# Patient Record
Sex: Male | Born: 1950 | Race: White | Hispanic: No | Marital: Single | Smoking: Former smoker
Health system: Southern US, Community
[De-identification: ages and names within clinical notes are randomized; demographics above are authoritative.]

## PROBLEM LIST (undated history)

## (undated) DIAGNOSIS — B1920 Unspecified viral hepatitis C without hepatic coma: Secondary | ICD-10-CM

## (undated) DIAGNOSIS — I10 Essential (primary) hypertension: Secondary | ICD-10-CM

## (undated) DIAGNOSIS — K429 Umbilical hernia without obstruction or gangrene: Secondary | ICD-10-CM

## (undated) DIAGNOSIS — K4090 Unilateral inguinal hernia, without obstruction or gangrene, not specified as recurrent: Secondary | ICD-10-CM

## (undated) DIAGNOSIS — K7460 Unspecified cirrhosis of liver: Secondary | ICD-10-CM

---

## 2013-10-24 ENCOUNTER — Encounter (HOSPITAL_COMMUNITY): Payer: Self-pay | Admitting: Emergency Medicine

## 2013-10-24 ENCOUNTER — Inpatient Hospital Stay (HOSPITAL_COMMUNITY)
Admission: EM | Admit: 2013-10-24 | Discharge: 2013-11-01 | DRG: 871 | Disposition: A | Discharge status: Home Health | Payer: Medicaid Other | Attending: Internal Medicine | Admitting: Internal Medicine

## 2013-10-24 ENCOUNTER — Emergency Department (HOSPITAL_COMMUNITY): Payer: Medicaid Other

## 2013-10-24 DIAGNOSIS — I10 Essential (primary) hypertension: Secondary | ICD-10-CM | POA: Diagnosis present

## 2013-10-24 DIAGNOSIS — B182 Chronic viral hepatitis C: Secondary | ICD-10-CM | POA: Diagnosis present

## 2013-10-24 DIAGNOSIS — E46 Unspecified protein-calorie malnutrition: Secondary | ICD-10-CM | POA: Diagnosis present

## 2013-10-24 DIAGNOSIS — J9600 Acute respiratory failure, unspecified whether with hypoxia or hypercapnia: Secondary | ICD-10-CM | POA: Diagnosis present

## 2013-10-24 DIAGNOSIS — A4101 Sepsis due to Methicillin susceptible Staphylococcus aureus: Secondary | ICD-10-CM

## 2013-10-24 DIAGNOSIS — K8020 Calculus of gallbladder without cholecystitis without obstruction: Secondary | ICD-10-CM | POA: Diagnosis present

## 2013-10-24 DIAGNOSIS — K859 Acute pancreatitis without necrosis or infection, unspecified: Secondary | ICD-10-CM | POA: Diagnosis present

## 2013-10-24 DIAGNOSIS — J15212 Pneumonia due to Methicillin resistant Staphylococcus aureus: Secondary | ICD-10-CM | POA: Diagnosis present

## 2013-10-24 DIAGNOSIS — A419 Sepsis, unspecified organism: Secondary | ICD-10-CM | POA: Diagnosis present

## 2013-10-24 DIAGNOSIS — Z79899 Other long term (current) drug therapy: Secondary | ICD-10-CM

## 2013-10-24 DIAGNOSIS — A4102 Sepsis due to Methicillin resistant Staphylococcus aureus: Principal | ICD-10-CM | POA: Diagnosis present

## 2013-10-24 DIAGNOSIS — K7460 Unspecified cirrhosis of liver: Secondary | ICD-10-CM

## 2013-10-24 DIAGNOSIS — R161 Splenomegaly, not elsewhere classified: Secondary | ICD-10-CM | POA: Diagnosis present

## 2013-10-24 DIAGNOSIS — K560 Paralytic ileus: Secondary | ICD-10-CM | POA: Diagnosis present

## 2013-10-24 DIAGNOSIS — Z886 Allergy status to analgesic agent status: Secondary | ICD-10-CM

## 2013-10-24 DIAGNOSIS — R6510 Systemic inflammatory response syndrome (SIRS) of non-infectious origin without acute organ dysfunction: Secondary | ICD-10-CM

## 2013-10-24 DIAGNOSIS — N179 Acute kidney failure, unspecified: Secondary | ICD-10-CM | POA: Diagnosis present

## 2013-10-24 DIAGNOSIS — K429 Umbilical hernia without obstruction or gangrene: Secondary | ICD-10-CM | POA: Diagnosis present

## 2013-10-24 DIAGNOSIS — J869 Pyothorax without fistula: Secondary | ICD-10-CM

## 2013-10-24 DIAGNOSIS — E236 Other disorders of pituitary gland: Secondary | ICD-10-CM | POA: Diagnosis present

## 2013-10-24 DIAGNOSIS — J90 Pleural effusion, not elsewhere classified: Secondary | ICD-10-CM | POA: Diagnosis present

## 2013-10-24 DIAGNOSIS — K4090 Unilateral inguinal hernia, without obstruction or gangrene, not specified as recurrent: Secondary | ICD-10-CM | POA: Diagnosis present

## 2013-10-24 DIAGNOSIS — K9189 Other postprocedural complications and disorders of digestive system: Secondary | ICD-10-CM | POA: Diagnosis present

## 2013-10-24 DIAGNOSIS — T361X5A Adverse effect of cephalosporins and other beta-lactam antibiotics, initial encounter: Secondary | ICD-10-CM | POA: Diagnosis not present

## 2013-10-24 DIAGNOSIS — K7030 Alcoholic cirrhosis of liver without ascites: Secondary | ICD-10-CM | POA: Diagnosis present

## 2013-10-24 DIAGNOSIS — Z87891 Personal history of nicotine dependence: Secondary | ICD-10-CM

## 2013-10-24 DIAGNOSIS — R233 Spontaneous ecchymoses: Secondary | ICD-10-CM | POA: Diagnosis not present

## 2013-10-24 DIAGNOSIS — D696 Thrombocytopenia, unspecified: Secondary | ICD-10-CM | POA: Diagnosis present

## 2013-10-24 DIAGNOSIS — E871 Hypo-osmolality and hyponatremia: Secondary | ICD-10-CM | POA: Diagnosis present

## 2013-10-24 DIAGNOSIS — N289 Disorder of kidney and ureter, unspecified: Secondary | ICD-10-CM

## 2013-10-24 DIAGNOSIS — N183 Chronic kidney disease, stage 3 unspecified: Secondary | ICD-10-CM | POA: Diagnosis present

## 2013-10-24 DIAGNOSIS — K567 Ileus, unspecified: Secondary | ICD-10-CM

## 2013-10-24 DIAGNOSIS — J9819 Other pulmonary collapse: Secondary | ICD-10-CM | POA: Diagnosis present

## 2013-10-24 DIAGNOSIS — J9601 Acute respiratory failure with hypoxia: Secondary | ICD-10-CM

## 2013-10-24 DIAGNOSIS — I129 Hypertensive chronic kidney disease with stage 1 through stage 4 chronic kidney disease, or unspecified chronic kidney disease: Secondary | ICD-10-CM | POA: Diagnosis present

## 2013-10-24 DIAGNOSIS — R188 Other ascites: Secondary | ICD-10-CM | POA: Diagnosis present

## 2013-10-24 DIAGNOSIS — E669 Obesity, unspecified: Secondary | ICD-10-CM | POA: Diagnosis present

## 2013-10-24 DIAGNOSIS — R7881 Bacteremia: Secondary | ICD-10-CM

## 2013-10-24 DIAGNOSIS — E8809 Other disorders of plasma-protein metabolism, not elsewhere classified: Secondary | ICD-10-CM | POA: Diagnosis present

## 2013-10-24 DIAGNOSIS — Z6832 Body mass index (BMI) 32.0-32.9, adult: Secondary | ICD-10-CM

## 2013-10-24 DIAGNOSIS — N170 Acute kidney failure with tubular necrosis: Secondary | ICD-10-CM | POA: Diagnosis present

## 2013-10-24 DIAGNOSIS — D539 Nutritional anemia, unspecified: Secondary | ICD-10-CM | POA: Diagnosis not present

## 2013-10-24 DIAGNOSIS — N184 Chronic kidney disease, stage 4 (severe): Secondary | ICD-10-CM | POA: Diagnosis present

## 2013-10-24 HISTORY — DX: Unspecified cirrhosis of liver: K74.60

## 2013-10-24 HISTORY — DX: Unspecified viral hepatitis C without hepatic coma: B19.20

## 2013-10-24 HISTORY — DX: Unilateral inguinal hernia, without obstruction or gangrene, not specified as recurrent: K40.90

## 2013-10-24 HISTORY — DX: Umbilical hernia without obstruction or gangrene: K42.9

## 2013-10-24 HISTORY — DX: Essential (primary) hypertension: I10

## 2013-10-24 LAB — CBC WITH DIFFERENTIAL/PLATELET
Basophils Absolute: 0 10*3/uL (ref 0.0–0.1)
Basophils Relative: 0 % (ref 0–1)
Eosinophils Absolute: 0 10*3/uL (ref 0.0–0.7)
Eosinophils Relative: 0 % (ref 0–5)
Lymphs Abs: 1.2 10*3/uL (ref 0.7–4.0)
MCH: 36.9 pg — ABNORMAL HIGH (ref 26.0–34.0)
MCHC: 36.5 g/dL — ABNORMAL HIGH (ref 30.0–36.0)
Monocytes Absolute: 2.5 10*3/uL — ABNORMAL HIGH (ref 0.1–1.0)
Neutro Abs: 27.4 10*3/uL — ABNORMAL HIGH (ref 1.7–7.7)
Neutrophils Relative %: 88 % — ABNORMAL HIGH (ref 43–77)
Platelets: 126 10*3/uL — ABNORMAL LOW (ref 150–400)
RBC: 3.52 MIL/uL — ABNORMAL LOW (ref 4.22–5.81)
RDW: 14.1 % (ref 11.5–15.5)

## 2013-10-24 LAB — URINALYSIS W MICROSCOPIC + REFLEX CULTURE
Glucose, UA: NEGATIVE mg/dL
Ketones, ur: 15 mg/dL — AB
Nitrite: NEGATIVE
Specific Gravity, Urine: 1.024 (ref 1.005–1.030)
Urobilinogen, UA: 0.2 mg/dL (ref 0.0–1.0)
pH: 5 (ref 5.0–8.0)

## 2013-10-24 LAB — COMPREHENSIVE METABOLIC PANEL
AST: 42 U/L — ABNORMAL HIGH (ref 0–37)
Alkaline Phosphatase: 119 U/L — ABNORMAL HIGH (ref 39–117)
BUN: 30 mg/dL — ABNORMAL HIGH (ref 6–23)
GFR calc Af Amer: 55 mL/min — ABNORMAL LOW (ref >90)
Glucose, Bld: 124 mg/dL — ABNORMAL HIGH (ref 70–99)
Potassium: 4 mEq/L (ref 3.5–5.1)
Total Bilirubin: 2.7 mg/dL — ABNORMAL HIGH (ref 0.3–1.2)
Total Protein: 7 g/dL (ref 6.0–8.3)

## 2013-10-24 LAB — RAPID URINE DRUG SCREEN, HOSP PERFORMED
Benzodiazepines: NOT DETECTED
Cocaine: NOT DETECTED
Opiates: NOT DETECTED

## 2013-10-24 LAB — AMMONIA: Ammonia: 46 umol/L (ref 11–60)

## 2013-10-24 LAB — MRSA PCR SCREENING: MRSA by PCR: POSITIVE — AB

## 2013-10-24 LAB — LIPASE, BLOOD: Lipase: 32 U/L (ref 11–59)

## 2013-10-24 LAB — PRO B NATRIURETIC PEPTIDE: Pro B Natriuretic peptide (BNP): 136 pg/mL — ABNORMAL HIGH (ref 0–125)

## 2013-10-24 LAB — LACTIC ACID, PLASMA: Lactic Acid, Venous: 1.9 mmol/L (ref 0.5–2.2)

## 2013-10-24 LAB — ETHANOL: Alcohol, Ethyl (B): <11 mg/dL (ref 0–11)

## 2013-10-24 MED ORDER — PIPERACILLIN-TAZOBACTAM 4.5 G IVPB: 4.5000 g | Freq: Once | INTRAVENOUS | Status: DC

## 2013-10-24 MED ORDER — ONDANSETRON HCL 4 MG/2ML IJ SOLN
4.0000 mg | Freq: Four times a day (QID) | INTRAMUSCULAR | Status: DC | PRN
  Administered 2013-10-24 – 2013-10-28 (×6): 4 mg via INTRAVENOUS
  Filled 2013-10-24 (×6): qty 2

## 2013-10-24 MED ORDER — PIPERACILLIN-TAZOBACTAM 3.375 G IVPB
3.3750 g | Freq: Three times a day (TID) | INTRAVENOUS | Status: DC
  Administered 2013-10-24 – 2013-10-26 (×5): 3.375 g via INTRAVENOUS
  Filled 2013-10-24 (×7): qty 50

## 2013-10-24 MED ORDER — VANCOMYCIN HCL IN DEXTROSE 1-5 GM/200ML-% IV SOLN
1000.0000 mg | Freq: Once | INTRAVENOUS | Status: AC
  Administered 2013-10-24: 1000 mg via INTRAVENOUS
  Filled 2013-10-24: qty 200

## 2013-10-24 MED ORDER — MORPHINE SULFATE 2 MG/ML IJ SOLN
2.0000 mg | INTRAMUSCULAR | Status: DC | PRN
Start: 2013-10-24 — End: 2013-10-25
  Administered 2013-10-24 – 2013-10-25 (×6): 2 mg via INTRAVENOUS
  Filled 2013-10-24 (×7): qty 1

## 2013-10-24 MED ORDER — PIPERACILLIN-TAZOBACTAM 3.375 G IVPB 30 MIN
3.3750 g | Freq: Once | INTRAVENOUS | Status: AC
  Administered 2013-10-24: 3.375 g via INTRAVENOUS
  Filled 2013-10-24: qty 50

## 2013-10-24 MED ORDER — SODIUM CHLORIDE 0.9 % IJ SOLN
3.0000 mL | Freq: Two times a day (BID) | INTRAMUSCULAR | Status: DC
  Administered 2013-10-24 – 2013-10-31 (×10): 3 mL via INTRAVENOUS

## 2013-10-24 MED ORDER — SODIUM CHLORIDE 0.9 % IV SOLN
INTRAVENOUS | Status: DC
  Administered 2013-10-24: 23:00:00 via INTRAVENOUS

## 2013-10-24 MED ORDER — VANCOMYCIN HCL IN DEXTROSE 750-5 MG/150ML-% IV SOLN
750.0000 mg | Freq: Two times a day (BID) | INTRAVENOUS | Status: DC
  Administered 2013-10-25 – 2013-11-01 (×15): 750 mg via INTRAVENOUS
  Filled 2013-10-24 (×17): qty 150

## 2013-10-24 MED ORDER — SODIUM CHLORIDE 0.9 % IV SOLN
INTRAVENOUS | Status: DC
  Administered 2013-10-24: 09:00:00 via INTRAVENOUS

## 2013-10-24 MED ORDER — IOHEXOL 300 MG/ML  SOLN
25.0000 mL | INTRAMUSCULAR | Status: AC
  Administered 2013-10-24: 25 mL via ORAL

## 2013-10-24 MED ORDER — FENTANYL CITRATE 0.05 MG/ML IJ SOLN
50.0000 ug | INTRAMUSCULAR | Status: AC | PRN
  Administered 2013-10-24 (×2): 50 ug via INTRAVENOUS
  Filled 2013-10-24: qty 2

## 2013-10-24 MED ORDER — FENTANYL CITRATE 0.05 MG/ML IJ SOLN
100.0000 ug | INTRAMUSCULAR | Status: AC | PRN
  Administered 2013-10-24 (×2): 100 ug via INTRAVENOUS
  Filled 2013-10-24 (×2): qty 2

## 2013-10-24 NOTE — ED Notes (Signed)
Called floor to give report. Floor changed pt's room. Waiting for room to be cleaned. RN stated she will call in approximately 10 minutes

## 2013-10-24 NOTE — ED Provider Notes (Signed)
CSN: [id number]     Arrival date & time 10/24/13  0813 History   First MD Initiated Contact with Patient 10/24/13 0825     Chief Complaint  Patient presents with  . Abdominal Pain  . Shortness of Breath    HPI Pt was seen at 0830. Per pt and his family, c/o gradual onset and worsening of persistent left sided abd "pain" for the past 5 days. Pt states he was evaluated at the [place] last Wednesday for same, told he was "dehydrated" and "had a UTI," rx cipro. States he has had left sided abd pain "since they pressed on my stomach at the [place]." Has been associated with SOB. Denies fevers, no CP/palpitations, no cough, no back pain, no N/V/D, no rash.    Past Medical History  Diagnosis Date  . Cirrhosis   . Hepatitis C   . Abdominal hernia     "for a really long time"  . Hypertension    History reviewed. No pertinent past surgical history.  History  Substance Use Topics  . Smoking status: Former Games developer  . Smokeless tobacco: Not on file  . Alcohol Use: No     Comment: Last etoh one month    Review of Systems ROS: Statement: All systems negative except as marked or noted in the HPI; Constitutional: Negative for fever and chills. ; ; Eyes: Negative for eye pain, redness and discharge. ; ; ENMT: Negative for ear pain, hoarseness, nasal congestion, sinus pressure and sore throat. ; ; Cardiovascular: +SOB. Negative for chest pain, palpitations, diaphoresis, and peripheral edema. ; ; Respiratory: Negative for cough, wheezing and stridor. ; ; Gastrointestinal: +abd pain. Negative for nausea, vomiting, diarrhea, blood in stool, hematemesis, jaundice and rectal bleeding. . ; ; Genitourinary: Negative for dysuria, flank pain and hematuria. ; ; Musculoskeletal: Negative for back pain and neck pain. Negative for swelling and trauma.; ; Skin: Negative for pruritus, rash, abrasions, blisters, bruising and skin lesion.; ; Neuro: Negative for headache, lightheadedness and neck stiffness. Negative for weakness,  altered level of consciousness , altered mental status, extremity weakness, paresthesias, involuntary movement, seizure and syncope.     Allergies  Codeine  Home Medications   Current Outpatient Rx  Name  Route  Sig  Dispense  Refill  . ciprofloxacin (CIPRO) 500 MG tablet   Oral   Take 500 mg by mouth 2 (two) times daily.         . furosemide (LASIX) 40 MG tablet   Oral   Take 40 mg by mouth daily.         Marland Kitchen ibuprofen (ADVIL,MOTRIN) 200 MG tablet   Oral   Take 1,000 mg by mouth daily as needed for mild pain or moderate pain.         Marland Kitchen lactulose (CHRONULAC) 10 GM/15ML solution   Oral   Take 20 g by mouth 3 (three) times daily.         . potassium chloride SA (K-DUR,KLOR-CON) 20 MEQ tablet   Oral   Take 20 mEq by mouth daily.         Marland Kitchen spironolactone (ALDACTONE) 100 MG tablet   Oral   Take 100 mg by mouth daily.          BP 116/78  Pulse 81  Temp(Src) 97.6 F (36.4 C) (Oral)  Resp 49  SpO2 91% Filed Vitals:   10/24/13 1415 10/24/13 1430 10/24/13 1445 10/24/13 1549  BP: 129/67 127/67 114/64 106/71  Pulse: 75 72 79 87  Temp:      TempSrc:      Resp: 39 24 20 20   SpO2: 92% 93% 93% 99%    Physical Exam 0835: Physical examination:  Nursing notes reviewed; Vital signs and O2 SAT reviewed;  Constitutional: Well developed, Well nourished, Uncomfortable appearing; Head:  Normocephalic, atraumatic; Eyes: EOMI, PERRL, No scleral icterus; ENMT: Mouth and pharynx normal, Mucous membranes dry; Neck: Supple, Full range of motion, No lymphadenopathy; Cardiovascular: Regular rate and rhythm, No gallop; Respiratory: Breath sounds coarse & equal bilaterally, No wheezes. Speaking full sentences, Normal respiratory effort/excursion; Chest: Nontender, Movement normal; Abdomen: Soft, +mid-epigastric, LUQ and LLQ tender to palp. +ventral hernia right abd NT to palp, reducible, no overlying erythema or ecchymosis. Nondistended, Normal bowel sounds; Genitourinary: No CVA  tenderness; Extremities: Pulses normal, No tenderness, No edema, No calf edema or asymmetry.; Neuro: AA&Ox3, Major CN grossly intact.  Speech clear. No gross focal motor or sensory deficits in extremities.; Skin: Color normal, Warm, Dry.   ED Course  Procedures    EKG Interpretation    Date/Time:    Ventricular Rate:  95 PR Interval:  142 QRS Duration: 96 QT Interval:  353 QTC Calculation: 444 R Axis:   -3 Text Interpretation:  Artifact Sinus rhythm Abnormal R-wave progression, early transition No significant change was found Since last tracing of earlier today            MDM  MDM Reviewed: nursing note and vitals Interpretation: labs, ECG, x-ray and CT scan     Results for orders placed during the hospital encounter of 10/24/13  URINALYSIS W MICROSCOPIC + REFLEX CULTURE      Result Value Range   Color, Urine AMBER (*) YELLOW   APPearance CLOUDY (*) CLEAR   Specific Gravity, Urine 1.024  1.005 - 1.030   pH 5.0  5.0 - 8.0   Glucose, UA NEGATIVE  NEGATIVE mg/dL   Hgb urine dipstick SMALL (*) NEGATIVE   Bilirubin Urine SMALL (*) NEGATIVE   Ketones, ur 15 (*) NEGATIVE mg/dL   Protein, ur NEGATIVE  NEGATIVE mg/dL   Urobilinogen, UA 0.2  0.0 - 1.0 mg/dL   Nitrite NEGATIVE  NEGATIVE   Leukocytes, UA TRACE (*) NEGATIVE   WBC, UA 0-2  <3 WBC/hpf   RBC / HPF 3-6  <3 RBC/hpf   Bacteria, UA RARE  RARE   Urine-Other MUCOUS PRESENT    CBC WITH DIFFERENTIAL      Result Value Range   WBC 31.1 (*) 4.0 - 10.5 K/uL   RBC 3.52 (*) 4.22 - 5.81 MIL/uL   Hemoglobin 14.2  13.0 - 17.0 g/dL   HCT 16.1 (*) 09.6 - 04.5 %   MCV 99.7  78.0 - 100.0 fL   MCH 36.9 (*) 26.0 - 34.0 pg   MCHC 36.5 (*) 30.0 - 36.0 g/dL   RDW 40.9  81.1 - 91.4 %   Platelets 126 (*) 150 - 400 K/uL   Neutrophils Relative % 88 (*) 43 - 77 %   Lymphocytes Relative 4 (*) 12 - 46 %   Monocytes Relative 8  3 - 12 %   Eosinophils Relative 0  0 - 5 %   Basophils Relative 0  0 - 1 %   Neutro Abs 27.4 (*) 1.7 -  7.7 K/uL   Lymphs Abs 1.2  0.7 - 4.0 K/uL   Monocytes Absolute 2.5 (*) 0.1 - 1.0 K/uL   Eosinophils Absolute 0.0  0.0 - 0.7 K/uL   Basophils Absolute 0.0  0.0 - 0.1 K/uL   RBC Morphology POLYCHROMASIA PRESENT     WBC Morphology TOXIC GRANULATION    PROTIME-INR      Result Value Range   Prothrombin Time 17.7 (*) 11.6 - 15.2 seconds   INR 1.50 (*) 0.00 - 1.49  COMPREHENSIVE METABOLIC PANEL      Result Value Range   Sodium 130 (*) 135 - 145 mEq/L   Potassium 4.0  3.5 - 5.1 mEq/L   Chloride 95 (*) 96 - 112 mEq/L   CO2 25  19 - 32 mEq/L   Glucose, Bld 124 (*) 70 - 99 mg/dL   BUN 30 (*) 6 - 23 mg/dL   Creatinine, Ser 1.61 (*) 0.50 - 1.35 mg/dL   Calcium 9.3  8.4 - 09.6 mg/dL   Total Protein 7.0  6.0 - 8.3 g/dL   Albumin 2.5 (*) 3.5 - 5.2 g/dL   AST 42 (*) 0 - 37 U/L   ALT 29  0 - 53 U/L   Alkaline Phosphatase 119 (*) 39 - 117 U/L   Total Bilirubin 2.7 (*) 0.3 - 1.2 mg/dL   GFR calc non Af Amer 47 (*) >90 mL/min   GFR calc Af Amer 55 (*) >90 mL/min  LIPASE, BLOOD      Result Value Range   Lipase 32  11 - 59 U/L  LACTIC ACID, PLASMA      Result Value Range   Lactic Acid, Venous 1.9  0.5 - 2.2 mmol/L  TROPONIN I      Result Value Range   Troponin I <0.30  <0.30 ng/mL  AMMONIA      Result Value Range   Ammonia 46  11 - 60 umol/L  PRO B NATRIURETIC PEPTIDE      Result Value Range   Pro B Natriuretic peptide (BNP) 136.0 (*) 0 - 125 pg/mL   Ct Abdomen Pelvis Wo Contrast 10/24/2013   CLINICAL DATA:  Pain left abdomen.  EXAM: CT ABDOMEN AND PELVIS WITHOUT CONTRAST  TECHNIQUE: Multidetector CT imaging of the abdomen and pelvis was performed following the standard protocol without intravenous contrast.  COMPARISON:  AP abdomen 10/24/2013.  FINDINGS: Liver is irregular suggesting cirrhosis. Splenomegaly. Prominent serpiginous structures are noted adjacent to the spleen suggesting varices. Mild peripancreatic edema noted. Mild pancreatitis cannot be excluded. Gallstones. The gallbladder is  nondistended. No biliary distention. No pericholecystic fluid collection. Gallbladder wall thickness normal by CT.  Adrenals normal. No focal renal abnormality. No hydronephrosis. Bladder nondistended. Prostate slightly irregular contour appearing calcifications in prostate. No free pelvic fluid.  Left inguinal hernia. No bowel herniation. Left hydrocele. No significant adenopathy. Aorta normal caliber.  Large umbilical hernia with herniation of fat only. Edema noted within the herniated fat suggesting cellulitis. No bowel herniation is noted. There are slightly distended loops of small bowel throughout the abdomen. The colonic gas pattern is normal, there is no colonic distention. Sigmoid colonic diverticulosis noted. These findings suggest the presence of an adynamic ileus versus partial small bowel obstruction. Adynamic ileus can occur secondary to pancreatitis. No free air. No pneumatosis.  Bilateral pleural effusions. Bibasilar atelectasis and/or pneumonia. Borderline cardiomegaly. Degenerative changes lumbar spine.  IMPRESSION: 1. Findings suggesting cirrhosis with splenomegaly and varices. 2. Gallstones.  No biliary distention . 3. Mild peripancreatic edema, pancreatitis cannot be excluded. Probable associated adynamic ileus as there is mild small bowel distention. Followup abdominal series to exclude developing small bowel obstruction suggested. 4. Umbilical hernia. Umbilical hernia is prominent with herniation of fat only. Edema  noted in the herniated fat suggesting cellulitis. 5. Left inguinal hernia with herniation of fat only. Associated left hydrocele. 6. Bilateral pleural effusions and dense bibasilar atelectasis and/or pneumonia.   Electronically Signed   By: Maisie Fus  Register   On: 10/24/2013 14:12   Dg Chest 1 View 10/24/2013   CLINICAL DATA:  Shortness of breath.  EXAM: CHEST - 1 VIEW  FINDINGS: The patient has taken a markedly shallow inspiration. This limits evaluation of the chest. There is  mild prominence of the interstitial markings particularly within the left hemi thorax accentuated by technique. Underlying component of mild edema versus a mild interstitial infiltrate, infectious or inflammatory, cannot be excluded. Repeat evaluation with deeper inspiration is recommended if clinically possible. The cardiac silhouette is poorly visualized. The visualized osseous structures are unremarkable.  IMPRESSION: 1. Markedly shallow inspiration 2. Interstitial findings likely secondary to technique nor underlying component of a mild interstitial infiltrate cannot be totally excluded particularly if clinically warranted. 3. Repeat evaluation recommended   Electronically Signed   By: Salome Holmes M.D.   On: 10/24/2013 10:00   Dg Abd 1 View 10/24/2013   CLINICAL DATA:  Abdominal pain.  EXAM: ABDOMEN - 1 VIEW  COMPARISON:  None.  FINDINGS: Air is seen within distended loops of large and small bowel. There is a paucity of distal bowel gas. Mild S-shaped scoliosis is appreciated within the lumbar spine. A smoothly marginated rounded area of increased density projects within the central aspect of the pelvic inlet. This may represent an internal device if clinically appropriate possibly external artifact.  IMPRESSION: Nonspecific nonobstructive bowel gas pattern. An early or partial small bowel obstruction versus an ileus cannot be excluded. Surveillance evaluation recommended. Smoothly marginated area of increased density at the level of pelvic inlet as described above.   Electronically Signed   By: Salome Holmes M.D.   On: 10/24/2013 09:58    1420:  Will start IV abx for undifferentiated sepsis. Remains afebrile. No N/V/D while in the ED. Dx and testing d/w pt and family.  Questions answered.  Verb understanding, agreeable to admit. T/C to [facility] Resident, case discussed, including:  HPI, pertinent PM/SHx, VS/PE, dx testing, ED course and treatment:  Agreeable to come to ED for eval to admit.  Laray Anger, DO 10/26/13 2109

## 2013-10-24 NOTE — ED Notes (Signed)
Internal medicine at bedside

## 2013-10-24 NOTE — H&P (Signed)
Date: 10/24/2013               Patient Name:  [name] [name] MRN: [id number]  DOB: [birth year] Age / Sex: 62 y.o., male   PCP: No Pcp Per Patient         Medical Service: Internal Medicine Teaching Service         Attending Physician: Dr. Inez Catalina, MD    First Contact: Dr. Johna Roles Pager: [phone]  Second Contact: Dr. Burtis Junes  Pager: [phone]       After Hours (After 5p/  First Contact Pager: [phone]  weekends / holidays): Second Contact Pager: [phone]   Chief Complaint: left sided abdominal pain  History of Present Illness:   [name] [name] is a 62 year old man with past medical history of HTN, untreated chronic Hepatitis C infection, compensated liver cirrhosis, and umbilical hernia who presents with left sided abdominal pain of 5 day duration. Pt reports that last Monday (1 week ago) he began having poor appetite and decreased PO intake. Two days later on Wed he began having shaking chills and fevers. He then went to the [place] in [place] and was found to have a UTI. He received IV fluids for dehydration and ciprofloxacin for UTI which he was prescribed to take 500 mg BID at home. He reports no urinary symptoms at that time or currently. Pt reports that the physician pressed on his abdomen a lot, especially his left side. Since then he has been having severe sharp left sided abdominal pain with radiation to his left flank and left rib cage that is associated with dyspnea. Pain is unrelieved if he leans forward but improves if he stands up. He no longer reports shaking chills or fever. He states he has been having intermittent nausea without vomiting for the past several days. He is on lactulose at home (for past history of hepatic encephalopathy) so he has loose stools at baseline without evidence of blood. He denies confusion, cough, rhinorrhea, sore throat, joint pain, bleeding, or rash. He has had no recent sick contacts . He has never had similar pain before and denies ever requiring   abdominal tap in the past. He reports being compliant with diuretic therapy (40 mg lasix & spironolactone 100 mg daily) for cirrhosis. His mother died at age 49 from colon cancer. He  states he has had colonoscopy in the past which he reports was normal. Also has had EGD in the past that was normal. He reports his last alcoholic drink was 1 month ago and that he no longer uses drugs or smokes.= cigarrettes.              Meds:  Medication Details Provider    ciprofloxacin (CIPRO) 500 MG tablet Take 500 mg by mouth 2 (two) times daily.     furosemide (LASIX) 40 MG tablet Take 40 mg by mouth daily.     ibuprofen (ADVIL,MOTRIN) 200 MG tablet Take 1,000 mg by mouth daily as needed for mild pain or moderate pain.     lactulose (CHRONULAC) 10 GM/15ML solution Take 20 g by mouth 3 (three) times daily.     potassium chloride SA (K-DUR,KLOR-CON) 20 MEQ tablet Take 20 mEq by mouth daily.     spironolactone (ALDACTONE) 100 MG tablet Take 100 mg by mouth daily.            Current Facility-Administered Medications  Medication Dose Route Frequency Provider Last Rate Last Dose  . 0.9 %  sodium chloride infusion  Intravenous Continuous Laray Anger, DO      . morphine 2 MG/ML injection 2 mg  2 mg Intravenous Q3H PRN Christen Bame, MD   2 mg at 10/24/13 1511   Current Outpatient Prescriptions  Medication Sig Dispense Refill  . ciprofloxacin (CIPRO) 500 MG tablet Take 500 mg by mouth 2 (two) times daily.      . furosemide (LASIX) 40 MG tablet Take 40 mg by mouth daily.      Marland Kitchen ibuprofen (ADVIL,MOTRIN) 200 MG tablet Take 1,000 mg by mouth daily as needed for mild pain or moderate pain.      Marland Kitchen lactulose (CHRONULAC) 10 GM/15ML solution Take 20 g by mouth 3 (three) times daily.      . potassium chloride SA (K-DUR,KLOR-CON) 20 MEQ tablet Take 20 mEq by mouth daily.      Marland Kitchen spironolactone (ALDACTONE) 100 MG tablet Take 100 mg by mouth daily.        Allergies: Allergies as of 10/24/2013 - Review Complete  10/24/2013  Allergen Reaction Noted  . Codeine Hives 10/24/2013   Past Medical History  Diagnosis Date  . Cirrhosis   . Hepatitis C   . Abdominal hernia     "for a really long time"  . Hypertension    History reviewed. No pertinent past surgical history. No family history on file. History   Social History  . Marital Status: Single    Spouse Name: N/A    Number of Children: N/A  . Years of Education: N/A   Occupational History  . Not on file.   Social History Main Topics  . Smoking status: Former Games developer  . Smokeless tobacco: Not on file  . Alcohol Use: No     Comment: Last etoh one month  . Drug Use: No  . Sexual Activity: Not on file   Other Topics Concern  . Not on file   Social History Narrative  . No narrative on file    Review of Systems: Review of Systems  Constitutional: Positive for chills and diaphoresis. Negative for fever and weight loss.  HENT: Negative for congestion and sore throat.   Respiratory: Positive for shortness of breath (with pain). Negative for cough.   Cardiovascular: Positive for chest pain (left sided rib cage). Negative for palpitations and leg swelling.  Gastrointestinal: Positive for nausea, abdominal pain and diarrhea (loose stools at baseline). Negative for vomiting, constipation, blood in stool and melena.  Genitourinary: Positive for flank pain (left sided). Negative for dysuria, urgency, frequency and hematuria.  Musculoskeletal: Negative for falls and joint pain.  Skin: Negative for rash.  Neurological: Positive for dizziness. Negative for focal weakness and headaches.  Endo/Heme/Allergies: Does not bruise/bleed easily.     Physical Exam: Blood pressure 114/64, pulse 79, temperature 97.6 F (36.4 C), temperature source Oral, resp. rate 20, SpO2 93.00%. Physical Exam  Constitutional: He is oriented to person, place, and time. He appears well-developed and well-nourished. No distress.  Obese, no gynecomastia  HENT:  Head:  Normocephalic and atraumatic.  Nose: Nose normal.  Mouth/Throat: Oropharynx is clear and moist. No oropharyngeal exudate.  Eyes: EOM are normal.  Neck: Normal range of motion. Neck supple.  Cardiovascular: Normal rate, regular rhythm and normal heart sounds.  Exam reveals no friction rub.   No murmur heard. Pulmonary/Chest: He has no wheezes. He has no rales. He exhibits tenderness (left sided ).  tachypneic   Abdominal: Bowel sounds are normal. He exhibits distension. There is tenderness (TTP of LUQ> LLQ).  There is no guarding. A hernia (large umbilical hernia ) is present.  No caput medusa  No fluid wave Splenomegaly  Musculoskeletal: Normal range of motion. He exhibits no edema (trace b/l pedal edema).  Neurological: He is alert and oriented to person, place, and time.  Skin: Skin is warm and dry. No rash noted. He is not diaphoretic. No erythema. No pallor.  No spider angiomas or telangiectasias  Psychiatric: He has a normal mood and affect. His behavior is normal. Judgment and thought content normal.  No asterixis       Lab results: Basic Metabolic Panel:  Recent Labs  16/10/96 0842  NA 130*  K 4.0  CL 95*  CO2 25  GLUCOSE 124*  BUN 30*  CREATININE 1.50*  CALCIUM 9.3   Liver Function Tests:  Recent Labs  10/24/13 0842  AST 42*  ALT 29  ALKPHOS 119*  BILITOT 2.7*  PROT 7.0  ALBUMIN 2.5*    Recent Labs  10/24/13 0842  LIPASE 32    Recent Labs  10/24/13 0842  AMMONIA 46   CBC:  Recent Labs  10/24/13 0842  WBC 31.1*  NEUTROABS 27.4*  HGB 14.2  HCT 35.1*  MCV 99.7  PLT 126*   Cardiac Enzymes:  Recent Labs  10/24/13 0842  TROPONINI <0.30   BNP:  Recent Labs  10/24/13 0842  PROBNP 136.0*   D-Dimer: No results found for this basename: DDIMER,  in the last 72 hours CBG: No results found for this basename: GLUCAP,  in the last 72 hours Hemoglobin A1C: No results found for this basename: HGBA1C,  in the last 72 hours Fasting  Lipid Panel: No results found for this basename: CHOL, HDL, LDLCALC, TRIG, CHOLHDL, LDLDIRECT,  in the last 72 hours Thyroid Function Tests: No results found for this basename: TSH, T4TOTAL, FREET4, T3FREE, THYROIDAB,  in the last 72 hours Anemia Panel: No results found for this basename: VITAMINB12, FOLATE, FERRITIN, TIBC, IRON, RETICCTPCT,  in the last 72 hours Coagulation:  Recent Labs  10/24/13 0842  LABPROT 17.7*  INR 1.50*   Urine Drug Screen: Drugs of Abuse  No results found for this basename: labopia, cocainscrnur, labbenz, amphetmu, thcu, labbarb    Alcohol Level: No results found for this basename: ETH,  in the last 72 hours Urinalysis:  Recent Labs  10/24/13 1240  COLORURINE AMBER*  LABSPEC 1.024  PHURINE 5.0  GLUCOSEU NEGATIVE  HGBUR SMALL*  BILIRUBINUR SMALL*  KETONESUR 15*  PROTEINUR NEGATIVE  UROBILINOGEN 0.2  NITRITE NEGATIVE  LEUKOCYTESUR TRACE*    Imaging results:  Ct Abdomen Pelvis Wo Contrast  10/24/2013   CLINICAL DATA:  Pain left abdomen.  EXAM: CT ABDOMEN AND PELVIS WITHOUT CONTRAST  TECHNIQUE: Multidetector CT imaging of the abdomen and pelvis was performed following the standard protocol without intravenous contrast.  COMPARISON:  AP abdomen 10/24/2013.  FINDINGS: Liver is irregular suggesting cirrhosis. Splenomegaly. Prominent serpiginous structures are noted adjacent to the spleen suggesting varices. Mild peripancreatic edema noted. Mild pancreatitis cannot be excluded. Gallstones. The gallbladder is nondistended. No biliary distention. No pericholecystic fluid collection. Gallbladder wall thickness normal by CT.  Adrenals normal. No focal renal abnormality. No hydronephrosis. Bladder nondistended. Prostate slightly irregular contour appearing calcifications in prostate. No free pelvic fluid.  Left inguinal hernia. No bowel herniation. Left hydrocele. No significant adenopathy. Aorta normal caliber.  Large umbilical hernia with herniation of fat  only. Edema noted within the herniated fat suggesting cellulitis. No bowel herniation is noted. There are slightly distended  loops of small bowel throughout the abdomen. The colonic gas pattern is normal, there is no colonic distention. Sigmoid colonic diverticulosis noted. These findings suggest the presence of an adynamic ileus versus partial small bowel obstruction. Adynamic ileus can occur secondary to pancreatitis. No free air. No pneumatosis.  Bilateral pleural effusions. Bibasilar atelectasis and/or pneumonia. Borderline cardiomegaly. Degenerative changes lumbar spine.  IMPRESSION: 1. Findings suggesting cirrhosis with splenomegaly and varices. 2. Gallstones.  No biliary distention . 3. Mild peripancreatic edema, pancreatitis cannot be excluded. Probable associated adynamic ileus as there is mild small bowel distention. Followup abdominal series to exclude developing small bowel obstruction suggested. 4. Umbilical hernia. Umbilical hernia is prominent with herniation of fat only. Edema noted in the herniated fat suggesting cellulitis. 5. Left inguinal hernia with herniation of fat only. Associated left hydrocele. 6. Bilateral pleural effusions and dense bibasilar atelectasis and/or pneumonia.   Electronically Signed   By: Maisie Fus  Register   On: 10/24/2013 14:12   Dg Chest 1 View  10/24/2013   CLINICAL DATA:  Shortness of breath.  EXAM: CHEST - 1 VIEW  FINDINGS: The patient has taken a markedly shallow inspiration. This limits evaluation of the chest. There is mild prominence of the interstitial markings particularly within the left hemi thorax accentuated by technique. Underlying component of mild edema versus a mild interstitial infiltrate, infectious or inflammatory, cannot be excluded. Repeat evaluation with deeper inspiration is recommended if clinically possible. The cardiac silhouette is poorly visualized. The visualized osseous structures are unremarkable.  IMPRESSION: 1. Markedly shallow  inspiration 2. Interstitial findings likely secondary to technique nor underlying component of a mild interstitial infiltrate cannot be totally excluded particularly if clinically warranted. 3. Repeat evaluation recommended   Electronically Signed   By: Salome Holmes M.D.   On: 10/24/2013 10:00   Dg Abd 1 View  10/24/2013   CLINICAL DATA:  Abdominal pain.  EXAM: ABDOMEN - 1 VIEW  COMPARISON:  None.  FINDINGS: Air is seen within distended loops of large and small bowel. There is a paucity of distal bowel gas. Mild S-shaped scoliosis is appreciated within the lumbar spine. A smoothly marginated rounded area of increased density projects within the central aspect of the pelvic inlet. This may represent an internal device if clinically appropriate possibly external artifact.  IMPRESSION: Nonspecific nonobstructive bowel gas pattern. An early or partial small bowel obstruction versus an ileus cannot be excluded. Surveillance evaluation recommended. Smoothly marginated area of increased density at the level of pelvic inlet as described above.   Electronically Signed   By: Salome Holmes M.D.   On: 10/24/2013 09:58    Other results: EKG: Date/Time:  Ventricular Rate: 95  PR Interval: 142  QRS Duration: 96  QT Interval: 353  QTC Calculation: 444  R Axis: -3  Text Interpretation: Artifact Sinus rhythm Abnormal R-wave progression, early transition No significant change was found Since last tracing of earlier today   Assessment & Plan by Problem: Principal Problem:   Sepsis Active Problems:   Hep C w/o coma, chronic   Liver cirrhosis   Assessment: 62 year old man with past medical history of HTN, untreated chronic Hepatitis C infection, liver cirrhosis, and umbilical hernia who presented on 10/24/13 with left sided abdominal pain of 5 day duration.   Plan:    SIRS without source of infection - Pt with left sided upper quadrant pain with radiation to flank on admission with tachypnea (28-49)  and leukocytosis (31K) with left shift (>20% bands) and neutrophilia (88%). Source of  infection is currently unknown. Lipase within normal limits however on CT abdomen there is presence of mild peripancreatic edema and bilateral pleural effusions concerning for mild pancreatitis. He reports no recent alcohol use. Pyelonephritis possible due to history of rigors and reported UTI 5 days ago (current UA w/o infection) with  left sided CVA tenderness. There was no hydronephrosis on imaging. SBP could also be possible, however no ascites on CT abdomen and no fluid wave on exam for paracentesis. Lactic acid levels were normal and there was no AG on admission to suggest ischemic bowel.  Pt with large umbilical hernia (reports has been present for long time) with herniation of fat only and questionable early or partial SBO vs adynamic ileus. Pt without fever and cough but dyspnea (with pain) and bilateral pleural effusions and dense bibasilar atelectasis with concern for pneumonia. There was evidence of diverticulosis and cholelithiasis on imaging however no evidence of acute cholecystitis or diverticulitis. Troponin(x1) was negative and 12-lead EKG was NSR without cardiac ischemic changes. Pt received IV vancomycin and zosyn in ED.   -NPO for bowel rest -75 mL/hr  IV fluids -Obtain blood cultures x 2 -Start IV vancomycin & zosyn for broad spectrum coverage  -Morphine PRN pain  -Add cefotaxime if decompensation for SBP  -Consider CT chest   Chronic Hepatitis C infection - Pt reports he was never treated. Complicated by liver cirrhosis. On admission pt with transaminitis (elevated AST 42).  No evidence of rash, glomerulonephritis, or neuropathy to suggest cryoglobulinemia.  -Obtain UDS -Limit tylenol use -Obtain HIV Ab  Compensated Liver Cirrhosis -  Most likely secondary to past alcohol abuse and untreated viral hepatitis C infection. There was evidence of splenomegaly on CT abdomen and distension on exam  without other signs of stigmata of chronic liver disease. Pt's albumin is 2.5, bilirubin 2.4,  INR 1.5, ascites that is medically controlled, and encephalopathy medically controlled with Child-Pugh Class C with life expectancy 1-3 years. CT abdomen with findings of cirrhosis with splenomegaly. Pt without ascites, fever, and encephalopathy, however with significant abdominal pain and distension still concern for SBP.  Pt with last reported alcoholic drink 1 month ago. Pt reports compliance with diuretic therapy at home. On exam there was distension however hard to appreciate ascites, thus paracentesis was not performed. -Hold home 40 mg lasix & spironolactone 100 mg daily, lactulose 20 TID  -Obtain ethanol levels -Add cefotaxime 4g/d if decompensation for SBP    Thrombocytopenia  Pt with splenomegaly and  platelet count on admission of 126K (clumping present). Baseline platelet count unknown. No reports of recent bleeding. -Continue to monitor CBC  Renal Insufficiency - Pt presented with Cr of 1.5 with unknown baseline. Possible etiology is pre-renal azotemia (hypovolemia) vs ATN (sepsis). Post-renal causes less likely considering no hydronpehrosis on CT abdomen  Pt without HTN, edema, or proteinuria on UA to suggest nephrotic syndrome or glomerulonephritis.   -Hold home 40 mg lasix & spironolactone 100 mg daily, lactulose 20 TID  -Avoid nephrotoxins (ACEi, ARB, NSAIDS, contrast) -Continue to monitor BMP     Diet: NPO DVT PPx: SCD Code: Full if >50% chance of survival      Dispo: Disposition is deferred at this time, awaiting improvement of current medical problems. Anticipated discharge in approximately 2-4 day(s).   The patient does have a current PCP (No Pcp Per Patient) and does need an [facility] hospital follow-up appointment after discharge.  The patient does not have transportation limitations that hinder transportation to clinic appointments.  Signed:  Otis Brace, MD 10/24/2013, 3:15  PM

## 2013-10-24 NOTE — ED Notes (Signed)
Pt was seen at [facility] last week for severe shaking and was found to have fever, uti, and dehydration.  Pt is here today LUQ pain and shortness of breath since when he was in the [place] and they pressed in that area.  Pt has liver issues

## 2013-10-24 NOTE — Progress Notes (Signed)
ANTIBIOTIC CONSULT NOTE - INITIAL  Pharmacy Consult for vancomycin and Zosyn Indication: sepsis  Allergies  Allergen Reactions  . Codeine Hives    Patient Measurements:   Adjusted Body Weight: 91 kg, 68.5 inches  Vital Signs: Temp: 97.6 F (36.4 C) (11/17 0825) Temp src: Oral (11/17 0825) BP: 106/71 mmHg (11/17 1549) Pulse Rate: 87 (11/17 1549) Intake/Output from previous day:   Intake/Output from this shift:    Labs:  Recent Labs  10/24/13 0842  WBC 31.1*  HGB 14.2  PLT 126*  CREATININE 1.50*   CrCl is unknown because there is no height on file for the current visit. No results found for this basename: VANCOTROUGH, VANCOPEAK, VANCORANDOM, GENTTROUGH, GENTPEAK, GENTRANDOM, TOBRATROUGH, TOBRAPEAK, TOBRARND, AMIKACINPEAK, AMIKACINTROU, AMIKACIN,  in the last 72 hours   Microbiology: No results found for this or any previous visit (from the past 720 hour(s)).  Medical History: Past Medical History  Diagnosis Date  . Cirrhosis   . Hepatitis C   . Abdominal hernia     "for a really long time"  . Hypertension     Medications:  See med history Assessment: 62 year old man to be admitted for sepsis.  Vancomycin and Zosyn to start empirically.  Goal of Therapy:  Vancomycin trough level 15-20 mcg/ml  Plan:  Measure antibiotic drug levels at steady state Follow up culture results Vancomycin 1g IV x 1 dose, then 750mg  IV q12 Zosyn 3.375g IV q8h (infuse over 4 hours)   Mickeal Skinner 10/24/2013,4:06 PM

## 2013-10-25 ENCOUNTER — Encounter (HOSPITAL_COMMUNITY): Payer: Self-pay | Admitting: Radiology

## 2013-10-25 ENCOUNTER — Inpatient Hospital Stay (HOSPITAL_COMMUNITY): Payer: Medicaid Other

## 2013-10-25 DIAGNOSIS — D696 Thrombocytopenia, unspecified: Secondary | ICD-10-CM | POA: Diagnosis present

## 2013-10-25 DIAGNOSIS — N179 Acute kidney failure, unspecified: Secondary | ICD-10-CM | POA: Diagnosis present

## 2013-10-25 DIAGNOSIS — I10 Essential (primary) hypertension: Secondary | ICD-10-CM | POA: Diagnosis present

## 2013-10-25 DIAGNOSIS — J90 Pleural effusion, not elsewhere classified: Secondary | ICD-10-CM | POA: Diagnosis present

## 2013-10-25 DIAGNOSIS — E8809 Other disorders of plasma-protein metabolism, not elsewhere classified: Secondary | ICD-10-CM | POA: Diagnosis present

## 2013-10-25 DIAGNOSIS — K567 Ileus, unspecified: Secondary | ICD-10-CM | POA: Diagnosis present

## 2013-10-25 DIAGNOSIS — K7460 Unspecified cirrhosis of liver: Secondary | ICD-10-CM

## 2013-10-25 DIAGNOSIS — J9601 Acute respiratory failure with hypoxia: Secondary | ICD-10-CM | POA: Diagnosis present

## 2013-10-25 DIAGNOSIS — E871 Hypo-osmolality and hyponatremia: Secondary | ICD-10-CM | POA: Diagnosis present

## 2013-10-25 DIAGNOSIS — A419 Sepsis, unspecified organism: Secondary | ICD-10-CM

## 2013-10-25 DIAGNOSIS — K859 Acute pancreatitis without necrosis or infection, unspecified: Secondary | ICD-10-CM | POA: Diagnosis present

## 2013-10-25 LAB — HIV ANTIBODY (ROUTINE TESTING W REFLEX): HIV: NONREACTIVE

## 2013-10-25 LAB — COMPREHENSIVE METABOLIC PANEL
Alkaline Phosphatase: 103 U/L (ref 39–117)
BUN: 33 mg/dL — ABNORMAL HIGH (ref 6–23)
CO2: 25 mEq/L (ref 19–32)
Chloride: 95 mEq/L — ABNORMAL LOW (ref 96–112)
Creatinine, Ser: 1.52 mg/dL — ABNORMAL HIGH (ref 0.50–1.35)
GFR calc Af Amer: 54 mL/min — ABNORMAL LOW (ref >90)
GFR calc non Af Amer: 47 mL/min — ABNORMAL LOW (ref >90)
Glucose, Bld: 121 mg/dL — ABNORMAL HIGH (ref 70–99)
Potassium: 4.7 mEq/L (ref 3.5–5.1)
Total Bilirubin: 3.1 mg/dL — ABNORMAL HIGH (ref 0.3–1.2)

## 2013-10-25 LAB — BLOOD GAS, ARTERIAL
Acid-Base Excess: 2.3 mmol/L — ABNORMAL HIGH (ref 0.0–2.0)
Bicarbonate: 26.7 mEq/L — ABNORMAL HIGH (ref 20.0–24.0)
TCO2: 28.1 mmol/L (ref 0–100)
pCO2 arterial: 44.7 mmHg (ref 35.0–45.0)
pH, Arterial: 7.394 (ref 7.350–7.450)
pO2, Arterial: 63.9 mmHg — ABNORMAL LOW (ref 80.0–100.0)

## 2013-10-25 LAB — CBC
Hemoglobin: 13.7 g/dL (ref 13.0–17.0)
MCH: 36.6 pg — ABNORMAL HIGH (ref 26.0–34.0)
Platelets: 129 10*3/uL — ABNORMAL LOW (ref 150–400)
Platelets: 178 10*3/uL (ref 150–400)
RBC: 3.55 MIL/uL — ABNORMAL LOW (ref 4.22–5.81)
RBC: 3.77 MIL/uL — ABNORMAL LOW (ref 4.22–5.81)
RDW: 14.7 % (ref 11.5–15.5)
RDW: 14.9 % (ref 11.5–15.5)
WBC: 36.5 10*3/uL — ABNORMAL HIGH (ref 4.0–10.5)
WBC: 37 10*3/uL — ABNORMAL HIGH (ref 4.0–10.5)

## 2013-10-25 LAB — TROPONIN I: Troponin I: <0.3 ng/mL (ref <0.30)

## 2013-10-25 LAB — BASIC METABOLIC PANEL
CO2: 21 mEq/L (ref 19–32)
Calcium: 8.3 mg/dL — ABNORMAL LOW (ref 8.4–10.5)
Glucose, Bld: 105 mg/dL — ABNORMAL HIGH (ref 70–99)
Potassium: 5.6 mEq/L — ABNORMAL HIGH (ref 3.5–5.1)
Sodium: 129 mEq/L — ABNORMAL LOW (ref 135–145)

## 2013-10-25 LAB — PROCALCITONIN: Procalcitonin: 1.78 ng/mL

## 2013-10-25 LAB — LACTIC ACID, PLASMA: Lactic Acid, Venous: 2 mmol/L (ref 0.5–2.2)

## 2013-10-25 MED ORDER — MORPHINE SULFATE 2 MG/ML IJ SOLN
2.0000 mg | Freq: Once | INTRAMUSCULAR | Status: AC
  Administered 2013-10-25: 2 mg via INTRAVENOUS

## 2013-10-25 MED ORDER — MUPIROCIN 2 % EX OINT
1.0000 "application " | TOPICAL_OINTMENT | Freq: Two times a day (BID) | CUTANEOUS | Status: AC
  Administered 2013-10-26 – 2013-10-29 (×8): 1 via NASAL
  Filled 2013-10-25 (×2): qty 22

## 2013-10-25 MED ORDER — METRONIDAZOLE IN NACL 5-0.79 MG/ML-% IV SOLN
500.0000 mg | Freq: Three times a day (TID) | INTRAVENOUS | Status: DC
  Administered 2013-10-25 – 2013-10-26 (×3): 500 mg via INTRAVENOUS
  Filled 2013-10-25 (×6): qty 100

## 2013-10-25 MED ORDER — SODIUM CHLORIDE 0.9 % IV SOLN
INTRAVENOUS | Status: DC
  Administered 2013-10-29: 10 mL/h via INTRAVENOUS
  Administered 2013-10-31: 07:00:00 via INTRAVENOUS

## 2013-10-25 MED ORDER — CHLORHEXIDINE GLUCONATE CLOTH 2 % EX PADS
6.0000 | MEDICATED_PAD | Freq: Every day | CUTANEOUS | Status: AC
  Administered 2013-10-25 – 2013-10-29 (×5): 6 via TOPICAL

## 2013-10-25 MED ORDER — MORPHINE SULFATE 2 MG/ML IJ SOLN
2.0000 mg | Freq: Four times a day (QID) | INTRAMUSCULAR | Status: DC | PRN
  Administered 2013-10-25 – 2013-10-31 (×15): 2 mg via INTRAVENOUS
  Filled 2013-10-25 (×15): qty 1

## 2013-10-25 MED ORDER — CHLORHEXIDINE GLUCONATE 0.12 % MT SOLN
15.0000 mL | Freq: Two times a day (BID) | OROMUCOSAL | Status: DC
  Administered 2013-10-25 – 2013-11-01 (×13): 15 mL via OROMUCOSAL
  Filled 2013-10-25 (×17): qty 15

## 2013-10-25 MED ORDER — BIOTENE DRY MOUTH MT LIQD
15.0000 mL | Freq: Two times a day (BID) | OROMUCOSAL | Status: DC
  Administered 2013-10-25 – 2013-11-01 (×14): 15 mL via OROMUCOSAL

## 2013-10-25 MED ORDER — FUROSEMIDE 40 MG PO TABS
40.0000 mg | ORAL_TABLET | Freq: Every day | ORAL | Status: DC
  Filled 2013-10-25: qty 1

## 2013-10-25 MED ORDER — FUROSEMIDE 10 MG/ML IJ SOLN
40.0000 mg | Freq: Every day | INTRAMUSCULAR | Status: DC
  Administered 2013-10-25: 40 mg via INTRAVENOUS
  Filled 2013-10-25 (×2): qty 4

## 2013-10-25 NOTE — H&P (Signed)
  Date: 10/25/2013  Patient name: [name] [name]  Medical record number: [id number]  Date of birth: 12-05-1951   I have seen and evaluated [name] [name] and discussed their care with the Residency Team.  I saw [name] [name] who confirmed the history.  [name] [name] noted that since last week (monday) [name] [name] has been having decreased appetite and PO intake.  On Wed of last week, [name] [name] noted shaking chills and presented to the [place] in [place] where [name] [name] was diagnosed with a UTI.  [name] [name] did not have any urinary symptoms at the time.  [name] [name] was given IVF and Ciprofloxacin which [name] [name] reports that [name] [name] did take.  [name] [name] reports that since being evaluated by a physician, [name] [name] has had left sided chest and abdominal pain which has been worse with movement and taking a deep breath.  [name] [name] notes also lower left chest pain.  Associated symptoms in the past few days have been intermittent nausea and vomiting.  [name] [name] also has chronic loose stools due to lactulose use.  [name] [name] denies any blood.  [name] [name] has not had any further chills/fever.  [name] [name] is currently on home diuretic therapy due to his cirrhosis, which [name] [name] has been taking.  On exam, [name] [name] is alert, mildly jaundiced, mildly tachypneic with crackles on the left up to the mid chest.  [name] [name] has a distended abdomen, which is not tender except in the LUQ.  [name] [name] has TTP over the left rib cage, side and flank.  Possibly some CVA tenderness on the left, but difficult for patient to differentiate.  [name] [name] reports last BM on the morning of admission.  [name] [name] reports continued flatus.  Please refer to imaging in the resident note.  [name] [name] was noted to have an elevated WBC to 31 with a left shift and bandemia.  [name] [name] was also noted to have a mild hyponatremia and elevated Cr, unclear if this is chronic.  Tbili was also elevated.    Assessment and Plan: I have seen and evaluated the patient as outlined above. I agree with the formulated Assessment and Plan as detailed in the residents' admission note, with the following changes:   1. SIRS with unclear source -  PNA vs. SBP vs. Pancreatitis (fluid on CT scan) vs. Pyelonephritis - Broad spectrum Abx to start, I favor a pneumonia given his intermittent tachypnea and hypoxia on vitals.  [name] [name] also had dense bibasilar atelectasis and/or pneumonia on CT abdomen. - O2 as needed to keep O2 sats > 93% - Cultures, blood - Hold morphine for pain control as likely causing depressed BP - No good site on CT scan for paracentesis - Monitor in SDU - CBC/BMET follow - Continue NPO - IVF with normal saline sparingly given cirrhosis  2. Ileus - Will repeat CXR and AXR - IF worsening, consider NGT, will need to get records of EGD for varices prior to NGT if possible - NPO, bowel rest  Also with compensated liver cirrhosis currently, low platelets, renal insufficiency vs. CKD.  These are noted in the resident note.   Inez Catalina, MD 11/18/20141:00 PM

## 2013-10-25 NOTE — Progress Notes (Signed)
Utilization Review Completed.  

## 2013-10-25 NOTE — Consult Note (Signed)
PULMONARY  / CRITICAL CARE MEDICINE  Name: [name] [name] MRN: [id number] DOB: [birth year]    ADMISSION DATE:  10/24/2013 CONSULTATION DATE:  10/25/13  REFERRING MD :  Dr. Criselda Peaches PRIMARY SERVICE:  Internal medicine teaching service  CHIEF COMPLAINT:  Abdominal pain, distention, and shortness of breath   BRIEF PATIENT DESCRIPTION: 62 y.o. With HTN, untreated chronic hep C, cirrhosis and umbilical hernia was admitted 11/17 for hypovolemia and left sided abdominal pain secondary to a UTI diagnosed at [facility] in [place].  PCCM called to evaluate pleural effusion and ascites.  SIGNIFICANT EVENTS / STUDIES:  11/12 diagnosed with UTI at [facility] in [place] given Cipro 11/17 admitted 11/18 CT abd and pelvis Cirrhosis, large left pleural effusion, slightly dilated bowel loops no obstruction likely an ileus.  Cholelithiasis, and inflammation of pancreatic head.   LINES / TUBES: PIV  CULTURES: 11/17 Blood x2 >> neg  ANTIBIOTICS: 11/18 Flagyl >> 11/18 Vanc >> 11/18 Zosyn >>  PAST MEDICAL HISTORY :  Past Medical History  Diagnosis Date  . Cirrhosis   . Hepatitis C   . Abdominal hernia     "for a really long time"  . Hypertension    History reviewed. No pertinent past surgical history. Prior to Admission medications   Medication Sig Start Date End Date Taking? Authorizing Provider  ciprofloxacin (CIPRO) 500 MG tablet Take 500 mg by mouth 2 (two) times daily.   Yes Historical Provider, MD  furosemide (LASIX) 40 MG tablet Take 40 mg by mouth daily.   Yes Historical Provider, MD  ibuprofen (ADVIL,MOTRIN) 200 MG tablet Take 1,000 mg by mouth daily as needed for mild pain or moderate pain.   Yes Historical Provider, MD  lactulose (CHRONULAC) 10 GM/15ML solution Take 20 g by mouth 3 (three) times daily.   Yes Historical Provider, MD  potassium chloride SA (K-DUR,KLOR-CON) 20 MEQ tablet Take 20 mEq by mouth daily.   Yes Historical Provider, MD  spironolactone (ALDACTONE) 100 MG tablet Take 100 mg  by mouth daily.   Yes Historical Provider, MD   Allergies  Allergen Reactions  . Codeine Hives    FAMILY HISTORY:  No family history on file. SOCIAL HISTORY:  reports that he has quit smoking. He does not have any smokeless tobacco history on file. He reports that he does not drink alcohol or use illicit drugs.  REVIEW OF SYSTEMS:   Constitutional: Negative for fever, chills, weight loss, malaise/fatigue and diaphoresis.  HENT: Negative for hearing loss, ear pain, nosebleeds, congestion, sore throat, neck pain, tinnitus and ear discharge.   Eyes: Negative for blurred vision, double vision, photophobia, pain, discharge and redness.  Respiratory: Negative for cough, hemoptysis, sputum production, shortness of breath, wheezing and stridor.   Cardiovascular: Negative for chest pain, palpitations, orthopnea, claudication, leg swelling and PND.  Gastrointestinal: Negative for heartburn, nausea, vomiting, abdominal pain, diarrhea, constipation, blood in stool and melena.  Genitourinary: Negative for dysuria, urgency, frequency, hematuria and flank pain.  Musculoskeletal: Negative for myalgias, back pain, joint pain and falls.  Skin: Negative for itching and rash.  Neurological: Negative for dizziness, tingling, tremors, sensory change, speech change, focal weakness, seizures, loss of consciousness, weakness and headaches.  Endo/Heme/Allergies: Negative for environmental allergies and polydipsia. Does not bruise/bleed easily.  SUBJECTIVE:   VITAL SIGNS: Temp:  [97.6 F (36.4 C)-99 F (37.2 C)] 98.4 F (36.9 C) (11/18 1200) Pulse Rate:  [72-100] 82 (11/18 1200) Resp:  [18-42] 28 (11/18 0838) BP: (97-133)/(60-92) 103/65 mmHg (11/18 1200) SpO2:  [88 %-99 %]  93 % (11/18 1200) FiO2 (%):  [50 %] 50 % (11/18 0432) Weight:  [210 lb 12.2 oz (95.6 kg)] 210 lb 12.2 oz (95.6 kg) (11/17 1900)  PHYSICAL EXAMINATION: General:  Chronically ill appearing male, resting comfortably Neuro:  A&Ox4,  appropriate conversation skills, moves extremitiesx4 HEENT:  Mild scleral icterus, MM moist and pink Neck:  No JVD Cardiovascular:  RRR, no M  Lungs:  Diminished breath sounds L>R, with scattered fine rales Abdomen:  +BS, mildly protuberant, tender palpation Musculoskeletal:  No gross deformities, BLE swelling 2+ Skin:  Mildly jaundiced, no obvious breakdowns  Recent Labs Lab 10/24/13 0842 10/25/13 0614  NA 130* 129*  K 4.0 4.7  CL 95* 95*  CO2 25 25  BUN 30* 33*  CREATININE 1.50* 1.52*  GLUCOSE 124* 121*   Recent Labs Lab 10/24/13 0842 10/25/13 0614  HGB 14.2 13.6  HCT 35.1* 36.9*  WBC 31.1* 36.5*  PLT 126* 129*   Ct Abdomen Pelvis Wo Contrast  10/25/2013   CLINICAL DATA:  62 year old male with worsening abdominal pain and distention. Earlier abdominal radiographs for which pneumoperitoneum was difficult to exclude. Recently diagnosed cirrhosis and possible mild pancreatitis. Initial encounter.  EXAM: CT ABDOMEN AND PELVIS WITHOUT CONTRAST  TECHNIQUE: Multidetector CT imaging of the abdomen and pelvis was performed following the standard protocol without intravenous contrast.  COMPARISON:  Abdominal radiographs from 0811 hr the same day. CT Abdomen and Pelvis 10/24/2013.  FINDINGS: No additional contrast administered for this scan.  Interval increased left pleural effusion, moderate to large and with complete left lower lobe atelectasis. No pericardial effusion. No right pleural effusion. Continued confluent right lower lobe basal segment atelectasis versus consolidation with air bronchograms. This finding might have stimulated pneumoperitoneum on the earlier radiographs.  Gynecomastia.  Stable visualized osseous structures. Disc and endplate degeneration in the lumbar spine.  No pneumoperitoneum.  Trace fluid in an otherwise fact containing left inguinal hernia is stable. No free fluid in the pelvis. Unremarkable bladder. Negative distal colon. Sigmoid diverticulosis but no active  inflammation. Oral contrast has reached the proximal sigmoid colon. Negative left colon. Negative transverse colon. Negative right colon, cecum and appendix.  Featureless air and fluid filled small bowel loops with increased small bowel caliber since 10/24/2013, now up to 41 mm diameter. Still, no small bowel wall thickening. No abrupt small bowel transition point.  Nodular cirrhotic liver appears stable. Cholelithiasis. No pericholecystic inflammation. Stable spleen with suggestion of splint around all varices. Stable non contrast kidneys. No hydronephrosis or hydroureter.  The stomach now is decompressed. There is persistent inflammation in the lesser sac primarily about the head of the pancreas and duodenum (series 2, image 41). No associated fluid collection. No abdominal free fluid.  Umbilical hernia containing fat an a small volume of fluid is stable.  IMPRESSION: 1. Negative for pneumoperitoneum.  2. Appearance of bowel most compatible with ileus. Interval dilation of small bowel up to 41 mm diameter, but contrast and gas throughout the colon and no small bowel transition point.  3. Continued mild to moderate inflammation about the head of the pancreas with secondary involvement of the duodenum. No fluid collection or free fluid.  4. Large layering left pleural effusion has increased. Complete left lower lobe compressive atelectasis. Continued confluent right lung base atelectasis versus consolidation.  5. Cirrhosis. Cholelithiasis. Sigmoid diverticulosis. Small umbilical and left inguinal hernias containing fat and trace fluid.   Electronically Signed   By: Augusto Gamble M.D.   On: 10/25/2013 11:33   Ct  Abdomen Pelvis Wo Contrast  10/24/2013   CLINICAL DATA:  Pain left abdomen.  EXAM: CT ABDOMEN AND PELVIS WITHOUT CONTRAST  TECHNIQUE: Multidetector CT imaging of the abdomen and pelvis was performed following the standard protocol without intravenous contrast.  COMPARISON:  AP abdomen 10/24/2013.  FINDINGS:  Liver is irregular suggesting cirrhosis. Splenomegaly. Prominent serpiginous structures are noted adjacent to the spleen suggesting varices. Mild peripancreatic edema noted. Mild pancreatitis cannot be excluded. Gallstones. The gallbladder is nondistended. No biliary distention. No pericholecystic fluid collection. Gallbladder wall thickness normal by CT.  Adrenals normal. No focal renal abnormality. No hydronephrosis. Bladder nondistended. Prostate slightly irregular contour appearing calcifications in prostate. No free pelvic fluid.  Left inguinal hernia. No bowel herniation. Left hydrocele. No significant adenopathy. Aorta normal caliber.  Large umbilical hernia with herniation of fat only. Edema noted within the herniated fat suggesting cellulitis. No bowel herniation is noted. There are slightly distended loops of small bowel throughout the abdomen. The colonic gas pattern is normal, there is no colonic distention. Sigmoid colonic diverticulosis noted. These findings suggest the presence of an adynamic ileus versus partial small bowel obstruction. Adynamic ileus can occur secondary to pancreatitis. No free air. No pneumatosis.  Bilateral pleural effusions. Bibasilar atelectasis and/or pneumonia. Borderline cardiomegaly. Degenerative changes lumbar spine.  IMPRESSION: 1. Findings suggesting cirrhosis with splenomegaly and varices. 2. Gallstones.  No biliary distention . 3. Mild peripancreatic edema, pancreatitis cannot be excluded. Probable associated adynamic ileus as there is mild small bowel distention. Followup abdominal series to exclude developing small bowel obstruction suggested. 4. Umbilical hernia. Umbilical hernia is prominent with herniation of fat only. Edema noted in the herniated fat suggesting cellulitis. 5. Left inguinal hernia with herniation of fat only. Associated left hydrocele. 6. Bilateral pleural effusions and dense bibasilar atelectasis and/or pneumonia.   Electronically Signed   By:  Maisie Fus  Register   On: 10/24/2013 14:12   Dg Chest 1 View  10/24/2013   CLINICAL DATA:  Shortness of breath.  EXAM: CHEST - 1 VIEW  FINDINGS: The patient has taken a markedly shallow inspiration. This limits evaluation of the chest. There is mild prominence of the interstitial markings particularly within the left hemi thorax accentuated by technique. Underlying component of mild edema versus a mild interstitial infiltrate, infectious or inflammatory, cannot be excluded. Repeat evaluation with deeper inspiration is recommended if clinically possible. The cardiac silhouette is poorly visualized. The visualized osseous structures are unremarkable.  IMPRESSION: 1. Markedly shallow inspiration 2. Interstitial findings likely secondary to technique nor underlying component of a mild interstitial infiltrate cannot be totally excluded particularly if clinically warranted. 3. Repeat evaluation recommended   Electronically Signed   By: Salome Holmes M.D.   On: 10/24/2013 10:00   Dg Abd 1 View  10/24/2013   CLINICAL DATA:  Abdominal pain.  EXAM: ABDOMEN - 1 VIEW  COMPARISON:  None.  FINDINGS: Air is seen within distended loops of large and small bowel. There is a paucity of distal bowel gas. Mild S-shaped scoliosis is appreciated within the lumbar spine. A smoothly marginated rounded area of increased density projects within the central aspect of the pelvic inlet. This may represent an internal device if clinically appropriate possibly external artifact.  IMPRESSION: Nonspecific nonobstructive bowel gas pattern. An early or partial small bowel obstruction versus an ileus cannot be excluded. Surveillance evaluation recommended. Smoothly marginated area of increased density at the level of pelvic inlet as described above.   Electronically Signed   By: Salome Holmes M.D.  On: 10/24/2013 09:58   Dg Chest Port 1 View  10/25/2013   CLINICAL DATA:  Upper abdominal pain, shortness of breath  EXAM: PORTABLE CHEST - 1  VIEW  COMPARISON:  10/24/2013  FINDINGS: There is a moderate layering left pleural effusion. There is bibasilar airspace disease which may reflect atelectasis versus developing pneumonia, more focal at the right lung base. There is bilateral interstitial thickening and prominence of the central pulmonary vasculature. There is no pneumothorax. Stable heart size. Unremarkable osseous structures.  IMPRESSION: Moderate layering left pleural effusion. Bibasilar airspace disease which may reflect atelectasis versus developing pneumonia, more focal at the right lung base.   Electronically Signed   By: Elige Ko   On: 10/25/2013 08:31   Dg Abd Portable 2v  10/25/2013   CLINICAL DATA:  62 year old male with abdominal pain and distension. Fever and white count of unclear origin. Initial encounter. History of ventral abdominal hernia.  EXAM: PORTABLE ABDOMEN - 2 VIEW  COMPARISON:  CT Abdomen and Pelvis 10/24/2013 and earlier.  FINDINGS: Supine and left-side-down lateral decubitus views of the abdomen.  Widespread bowel gas, including distally in the rectum. Oral contrast has reached the colon as seen on the earlier CT. Continued gas-filled dilated small bowel loops in the lower abdomen, measuring up to 41 mm.  Unusual shaped gas and contrast collection in the left upper quadrant (arrow on the left-side-down decubitus view), but without gas layering under the right ribs. On the supine view, there is increased conspicuity of the walls of a small bowel loop located in the right upper quadrant, but this might be related to contrast coating of the loop.  Stable visualized osseous structures.  IMPRESSION: 1. Difficult to fully exclude the possibility of pneumoperitoneum. If clinical suspicion warrants, recommend repeat CT abdomen pelvis (non contrast).  2. Bowel gas pattern most suggestive of ileus. Retained oral contrast in the colon.  Study discussed by telephone with EMILY MULLEN on 10/25/2013 at 08:55 .   Electronically  Signed   By: Augusto Gamble M.D.   On: 10/25/2013 09:01    ASSESSMENT / PLAN: - Pleural effusion- likely secondary to cirrhosis  - evaluate with ultrasound, consider possible thoracentesis tomorrow.  Effusion likely due to lack of oncotic pressure but if we proceed with thora then will send for cytology, cells with dif, culture, glucose, LDH, and protein.   - Hypervolemic hyponatremia  - KVO fluids  - Lasix 40 mg PO  - Mild coagulopathy secondary to liver disease  - repeat PT/INR in am  - CBC  - Abdominal Pain  - Abd CT unremarkable may show ileus.  Also chronic cirrhotic changes.  - NPO for possible ileus.  - No obvious source of infection at this time, but large leukocytosis.    - Check PCT.  - Recheck lactate levels.   Terri Piedra [employer] Physician Assistant Student 10/25/13, 1:17 PM  Today's summary:  Pt. Is a 62 y.o. White male with history of alcohol abuse, chronic Hep C, and cirrhosis who was admitted 11/17 with left sided abdominal pain and worsening shortness of breath.  He was found to have a large left sided pleural effusion with some anasarca likely due to cirrhosis, and volume overload while being hospitalized.  Will KVO fluids now and restart lasix at this time.  Will visualize pleural effusion with ultrasound and determine whether thoracentesis will be needed.  Given severe left sided abd pain it is possible that pleural effusion may be contributing to it.  Patient  does have signficantly elevated white count, but no obvious source of infection.  CT abdomen relatively unremarkable and blood cultures negative.  Will check a PCT at this time.  Abx as per primary team.    Communicated with intern, recommend a paracentesis and if radiology is able to ID a pocket for thoracentesis would proceed with that as when looked at bedside with our U/S machine little fluid was identified and decision is made not to thora bedside.  Please note that patient's normal BP is 90-100  mmHg systolic.  Would recommend being very conservative with IVF as this will only make ascites worse.  Will f/u with you.  Alyson Reedy, M.D. Pulmonary and Critical Care Medicine [facility] Pager: [phone]  10/25/2013, 12:34 PM

## 2013-10-25 NOTE — Consult Note (Signed)
[name] Consult: 1:20 PM 10/25/2013  LOS: 1 day    Referring Provider: Dr Nedra Hai and Criselda Peaches of teaching service Primary Care Physician:  [facility] Primary Gastroenterologist: at [facility]    Reason for Consultation:  Does pt have esophageal varices?   HPI: [name] [name] is a 62 y.o. male.  Carries diagnoses of cirrhosis and hepatitis C.  Has never had GI bleeding.  Previous EGD in 2012/05/14 was normal per pt. Has never had paracentesis. Colonoscopy 05-14-2012 (mother died with colon CA age 43) also reported by pt as unremarkable.  He drank 3 to 4 beers per day before being diagnosed with cirrhosis, since then very little ETOH but did drink a beer about one month ago. Has never been treated for Hep C.  Presented to [facility] with 5 days of left abdominal pain, decreased appetite.  3 days ago had fevers and chills. Prescribed Cipro for UTI by [facility] staff and given IVF for dehydration. Never had UTI sxs but chills and fever resolved.  The left sided pain persisted and was worse after deep pressure palpation to LUQ at [facility].  Pain is relieved if he stands up for 30 minutes.  It is worse wih breathing, it locates in lower left rib cage, LUQ and into left back. Has not vomited.  Baseline of loose stools.  Takes Lactulose for hx of elevated ammonia level. This was rxd 2 months ago but it was inconvenient for him to take this due to resulting unpredictability of urgent stooling, so he dis not start it until last weekend. He does not endorse mental confusion in past or at present. He takes Aldactone 100, Lasix 40 at home.  Since receiving IVF at [facility] last week, his abdominal girth has mildly increased but weight on scale is stable.   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  Umbilical and left inguinal hernia present. The lungs show pleural effusion and suggest atx vs pna.  Lipase is 32, t bili is 3.1 and the transaminases were minimally elvated yesterday and have normalized today.   The attending medical team is reluctant to place NGT since they can not rule out esophageal varices.     Past Medical History  Diagnosis Date  . Cirrhosis   . Hepatitis C   . Abdominal hernia     "for a really long time"  . Hypertension     History reviewed. No pertinent past surgical history.  Prior to Admission medications   Medication Sig Start Date End Date Taking? Authorizing Provider  ciprofloxacin (CIPRO) 500 MG tablet Take 500 mg by mouth 2 (two) times daily.   Yes Historical Provider, MD  furosemide (LASIX) 40 MG tablet Take 40 mg by mouth daily.   Yes Historical Provider, MD  ibuprofen (ADVIL,MOTRIN) 200 MG tablet Take 1,000 mg by mouth daily as needed for mild pain or moderate pain.   Yes Historical Provider, MD  lactulose (CHRONULAC) 10 GM/15ML solution Take 20 g by mouth 3 (three) times daily.   Yes Historical Provider, MD  potassium chloride SA (K-DUR,KLOR-CON) 20 MEQ tablet Take 20 mEq by mouth daily.   Yes Historical Provider, MD  spironolactone (ALDACTONE) 100 MG tablet Take 100 mg by mouth daily.   Yes Historical Provider, MD    Scheduled Meds: . antiseptic oral  rinse  15 mL Mouth Rinse q12n4p  . chlorhexidine  15 mL Mouth Rinse BID  . Chlorhexidine Gluconate Cloth  6 each Topical Q0600  . metronidazole  500 mg Intravenous Q8H  . mupirocin ointment  1 application Nasal BID  . piperacillin-tazobactam (ZOSYN)  IV  3.375 g Intravenous Q8H  . sodium chloride  3 mL Intravenous Q12H  . vancomycin  750 mg Intravenous Q12H   Infusions: . sodium chloride 75 mL/hr at 10/24/13 2300   PRN Meds: morphine injection, ondansetron (ZOFRAN) IV   Allergies as of 10/24/2013 - Review Complete 10/24/2013  Allergen Reaction  Noted  . Codeine Hives 10/24/2013    No family history on file.  History   Social History  . Marital Status: Single    Spouse Name: N/A    Number of Children: N/A  . Years of Education: N/A   Occupational History  . Not on file.   Social History Main Topics  . Smoking status: Former Games developer  . Smokeless tobacco: Not on file  . Alcohol Use: No     Comment: Last etoh one month  . Drug Use: No  . Sexual Activity: Not on file   Other Topics Concern  . Not on file   Social History Narrative  . No narrative on file    REVIEW OF SYSTEMS: Constitutional:  Per HPI. ENT:  No nose bleeds Pulm:  + DOE with exertion, new problem CV:  No palpitations or angina.  Some pedal edema GU:  No dysuria, urine is quite dark GI:  Per HPI.   Heme:  No hx anemia, .    Transfusions:  none Neuro:  No headache, no dizziness.  Derm:  No pruritus.  Endocrine:  He is thirsty for ice or water, having been NPO since admission Immunization:  No flu, no pneumovax or hepatitis A or B vaccination "don't want them" Travel:  None.    PHYSICAL EXAM: Vital signs in last 24 hours: Filed Vitals:   10/25/13 1200  BP: 103/65  Pulse: 82  Temp: 98.4 F (36.9 C)  Resp:    Wt Readings from Last 3 Encounters:  10/24/13 95.6 kg (210 lb 12.2 oz)    General: looks ill, cmfortable Head:  No asymmetry or facial edema  Eyes:  No icterus, EOMI Ears:  Slightly HOH  Nose:  No discharge or congestion Mouth:  Clear, moist oral MM Neck:  No mass or JVD, no TMG Lungs:  Clear bil but overall diminished BS.  Dyspneic with speach Heart: RRR, no MRG Abdomen:  Soft, ND, NT, hypoactive BS, no HSM or mass.   Rectal: not done  GU:  No scrotal edema.   Musc/Skeltl: no joint deformity or swelling Extremities:  Slight pedal edema  Neurologic:  Oriented x 3, no tremor, no asterixis.  No somnolence or slow mentation.  Skin:  No jaundice Tattoos:  none Nodes:  No cervical adenopathy   Psych:  Pleasant, relaxed,  cooperative.   Intake/Output from previous day: 11/17 0701 - 11/18 0700 In: 761.8 [I.V.:561.8; IV Piggyback:200] Out: 375 [Urine:375] Intake/Output this shift:    LAB RESULTS:  Recent Labs  10/24/13 0842 10/25/13 0614  WBC 31.1* 36.5*  HGB 14.2 13.6  HCT 35.1* 36.9*  PLT 126* 129*   BMET Lab Results  Component Value Date   NA 129* 10/25/2013   NA 130* 10/24/2013   K 4.7 10/25/2013   K 4.0 10/24/2013   CL 95* 10/25/2013   CL 95* 10/24/2013  CO2 25 10/25/2013   CO2 25 10/24/2013   GLUCOSE 121* 10/25/2013   GLUCOSE 124* 10/24/2013   BUN 33* 10/25/2013   BUN 30* 10/24/2013   CREATININE 1.52* 10/25/2013   CREATININE 1.50* 10/24/2013   CALCIUM 8.4 10/25/2013   CALCIUM 9.3 10/24/2013   LFT  Recent Labs  10/24/13 0842 10/25/13 0614  PROT 7.0 6.5  ALBUMIN 2.5* 2.2*  AST 42* 30  ALT 29 22  ALKPHOS 119* 103  BILITOT 2.7* 3.1*   PT/INR Lab Results  Component Value Date   INR 1.50* 10/24/2013       protime                   17.7  Hepatitis Panel No results found for this basename: HEPBSAG, HCVAB, HEPAIGM, HEPBIGM,  in the last 72 hours  Drugs of Abuse     Component Value Date/Time   LABOPIA NONE DETECTED 10/24/2013 1240   COCAINSCRNUR NONE DETECTED 10/24/2013 1240   LABBENZ NONE DETECTED 10/24/2013 1240   AMPHETMU NONE DETECTED 10/24/2013 1240   THCU NONE DETECTED 10/24/2013 1240   LABBARB NONE DETECTED 10/24/2013 1240     RADIOLOGY STUDIES: Ct Abdomen Pelvis Wo Contrast 10/25/2013   CLINICAL DATA:  62 year old male with worsening abdominal pain and distention. Earlier abdominal radiographs for which pneumoperitoneum was difficult to exclude. Recently diagnosed cirrhosis and possible mild pancreatitis. Initial encounter.  EXAM: CT ABDOMEN AND PELVIS WITHOUT CONTRAST  TECHNIQUE: Multidetector CT imaging of the abdomen and pelvis was performed following the standard protocol without intravenous contrast.  COMPARISON:  Abdominal radiographs from 0811 hr the  same day. CT Abdomen and Pelvis 10/24/2013.  FINDINGS: No additional contrast administered for this scan.  Interval increased left pleural effusion, moderate to large and with complete left lower lobe atelectasis. No pericardial effusion. No right pleural effusion. Continued confluent right lower lobe basal segment atelectasis versus consolidation with air bronchograms. This finding might have stimulated pneumoperitoneum on the earlier radiographs.  Gynecomastia.  Stable visualized osseous structures. Disc and endplate degeneration in the lumbar spine.  No pneumoperitoneum.  Trace fluid in an otherwise fact containing left inguinal hernia is stable. No free fluid in the pelvis. Unremarkable bladder. Negative distal colon. Sigmoid diverticulosis but no active inflammation. Oral contrast has reached the proximal sigmoid colon. Negative left colon. Negative transverse colon. Negative right colon, cecum and appendix.  Featureless air and fluid filled small bowel loops with increased small bowel caliber since 10/24/2013, now up to 41 mm diameter. Still, no small bowel wall thickening. No abrupt small bowel transition point.  Nodular cirrhotic liver appears stable. Cholelithiasis. No pericholecystic inflammation. Stable spleen with suggestion of splint around all varices. Stable non contrast kidneys. No hydronephrosis or hydroureter.  The stomach now is decompressed. There is persistent inflammation in the lesser sac primarily about the head of the pancreas and duodenum (series 2, image 41). No associated fluid collection. No abdominal free fluid.  Umbilical hernia containing fat an a small volume of fluid is stable.  IMPRESSION: 1. Negative for pneumoperitoneum.  2. Appearance of bowel most compatible with ileus. Interval dilation of small bowel up to 41 mm diameter, but contrast and gas throughout the colon and no small bowel transition point.  3. Continued mild to moderate inflammation about the head of the pancreas  with secondary involvement of the duodenum. No fluid collection or free fluid.  4. Large layering left pleural effusion has increased. Complete left lower lobe compressive atelectasis. Continued confluent right lung base atelectasis  versus consolidation.  5. Cirrhosis. Cholelithiasis. Sigmoid diverticulosis. Small umbilical and left inguinal hernias containing fat and trace fluid.   Electronically Signed   By: Augusto Gamble M.D.   On: 10/25/2013 11:33   Ct Abdomen Pelvis Wo Contrast 10/24/2013   FINDINGS: Liver is irregular suggesting cirrhosis. Splenomegaly. Prominent serpiginous structures are noted adjacent to the spleen suggesting varices. Mild peripancreatic edema noted. Mild pancreatitis cannot be excluded. Gallstones. The gallbladder is nondistended. No biliary distention. No pericholecystic fluid collection. Gallbladder wall thickness normal by CT.  Adrenals normal. No focal renal abnormality. No hydronephrosis. Bladder nondistended. Prostate slightly irregular contour appearing calcifications in prostate. No free pelvic fluid.  Left inguinal hernia. No bowel herniation. Left hydrocele. No significant adenopathy. Aorta normal caliber.  Large umbilical hernia with herniation of fat only. Edema noted within the herniated fat suggesting cellulitis. No bowel herniation is noted. There are slightly distended loops of small bowel throughout the abdomen. The colonic gas pattern is normal, there is no colonic distention. Sigmoid colonic diverticulosis noted. These findings suggest the presence of an adynamic ileus versus partial small bowel obstruction. Adynamic ileus can occur secondary to pancreatitis. No free air. No pneumatosis.  Bilateral pleural effusions. Bibasilar atelectasis and/or pneumonia. Borderline cardiomegaly. Degenerative changes lumbar spine.  IMPRESSION: 1. Findings suggesting cirrhosis with splenomegaly and varices. 2. Gallstones.  No biliary distention . 3. Mild peripancreatic edema, pancreatitis  cannot be excluded. Probable associated adynamic ileus as there is mild small bowel distention. Followup abdominal series to exclude developing small bowel obstruction suggested. 4. Umbilical hernia. Umbilical hernia is prominent with herniation of fat only. Edema noted in the herniated fat suggesting cellulitis. 5. Left inguinal hernia with herniation of fat only. Associated left hydrocele. 6. Bilateral pleural effusions and dense bibasilar atelectasis and/or pneumonia.   Electronically Signed   By: Maisie Fus  Register   On: 10/24/2013 14:12   Dg Chest 1 View 10/24/2013   CLINICAL DATA:  Shortness of breath.  EXAM: CHEST - 1 VIEW  FINDINGS: The patient has taken a markedly shallow inspiration. This limits evaluation of the chest. There is mild prominence of the interstitial markings particularly within the left hemi thorax accentuated by technique. Underlying component of mild edema versus a mild interstitial infiltrate, infectious or inflammatory, cannot be excluded. Repeat evaluation with deeper inspiration is recommended if clinically possible. The cardiac silhouette is poorly visualized. The visualized osseous structures are unremarkable.  IMPRESSION: 1. Markedly shallow inspiration 2. Interstitial findings likely secondary to technique nor underlying component of a mild interstitial infiltrate cannot be totally excluded particularly if clinically warranted. 3. Repeat evaluation recommended   Electronically Signed   By: Salome Holmes M.D.   On: 10/24/2013 10:00   Dg Abd 1 View 10/24/2013   FINDINGS: Air is seen within distended loops of large and small bowel. There is a paucity of distal bowel gas. Mild S-shaped scoliosis is appreciated within the lumbar spine. A smoothly marginated rounded area of increased density projects within the central aspect of the pelvic inlet. This may represent an internal device if clinically appropriate possibly external artifact.  IMPRESSION: Nonspecific nonobstructive bowel  gas pattern. An early or partial small bowel obstruction versus an ileus cannot be excluded. Surveillance evaluation recommended. Smoothly marginated area of increased density at the level of pelvic inlet as described above.   Electronically Signed   By: Salome Holmes M.D.   On: 10/24/2013 09:58   Dg Chest Port 1 View 10/25/2013  FINDINGS: There is a moderate layering left pleural  effusion. There is bibasilar airspace disease which may reflect atelectasis versus developing pneumonia, more focal at the right lung base. There is bilateral interstitial thickening and prominence of the central pulmonary vasculature. There is no pneumothorax. Stable heart size. Unremarkable osseous structures.  IMPRESSION: Moderate layering left pleural effusion. Bibasilar airspace disease which may reflect atelectasis versus developing pneumonia, more focal at the right lung base.   Electronically Signed   By: Elige Ko   On: 10/25/2013 08:31   Dg Abd Portable 2v 10/25/2013     FINDINGS: Supine and left-side-down lateral decubitus views of the abdomen.  Widespread bowel gas, including distally in the rectum. Oral contrast has reached the colon as seen on the earlier CT. Continued gas-filled dilated small bowel loops in the lower abdomen, measuring up to 41 mm.  Unusual shaped gas and contrast collection in the left upper quadrant (arrow on the left-side-down decubitus view), but without gas layering under the right ribs. On the supine view, there is increased conspicuity of the walls of a small bowel loop located in the right upper quadrant, but this might be related to contrast coating of the loop.  Stable visualized osseous structures.  IMPRESSION: 1. Difficult to fully exclude the possibility of pneumoperitoneum. If clinical suspicion warrants, recommend repeat CT abdomen pelvis (non contrast).  2. Bowel gas pattern most suggestive of ileus. Retained oral contrast in the colon.  Study discussed by telephone with EMILY  MULLEN on 10/25/2013 at 08:55 .   Electronically Signed   By: Augusto Gamble M.D.   On: 10/25/2013 09:01    ENDOSCOPIC STUDIES:  EGD and colonoscopy in 05-01-12 at [facility] in [facility] Pt does not recall any pathology on these tests.   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 protime is prolonged, suggesting some degree of synthetic dysfunction.  Pt not aware of any varices being seen on EGD in 2012/05/01.  *  Abdominal pain with suggestion of early SBO/ileus on CT scan.  Pancreatic head inflammation with duodenal involvement but normal lipase level.    *  Acute renal failure.  Baseline renal function unknown  *  Pleural effusion. CCM, Dr Molli Knock raises ? If this is contributing to the LUQ pain.  CCM states that bedside ultrasound unable to detect sufficient fluid to warrant thoracentesis. *  Hyponatremia.   *  Marked leukocytosis.  No fever. Is this from lung process? Ileus ought not raise WBCs to this level. On Vanc IV, Flagyl IV and Zosyn.   *  Thrombocytopenia. Not surprising given splenomegaly     PLAN:     *  Discussed case with Dr Christella Hartigan.  He sees no reason not to place NGT, as there is no strong evidence for esophageal varices.  However pt is clinically better with diminished pain and non-tender abdominal exam (however, this may be from the morphine), so you may be able to hold off on the NGT.  *  I ordered hepatitis B surface AB, hep C Ab, Hep A total and IgM Ab (unable to order hepatitis A IgG, not on lab test list). This to evaluate for need of Hep A and B vaccination as well as to confirm the Hep C.  Will not order Hep C viral load, that should be deferred to [place] where he is followed  It is costly and MDs here have no plans to initiate therapies for Hep C.     Jennye Moccasin  10/25/2013, 1:20 PM Pager:  [phone]     ________________________________________________________________________  [facility] MD note:  I personally examined  the patient, reviewed the data and agree with the assessment and plan described above.  Unclear diagnosis here; the left sided abdominal pains have nearly resolved, he is hungry. Given 2013 EGD/colonoscopy at [facility] GI etiology seems less likely.  His admitting WBC was VERY high and is still very elevated, now has GP cocci in clusters growing in blood culture.  Perhaps empyema? Housestaff in room today said they were planning on IR thoracentesis which I agree with.   NG tube was never placed, but for it would be safe if needed.  Even in the presence of known esophageal varices, NG tube placement is generally very safe.  Pending decision, outcome of thoracentesis, I think it is OK to advance his diet as tolerated (heart healthy, low salt).   Rob Bunting, MD [facility] Gastroenterology Pager [phone]

## 2013-10-25 NOTE — Progress Notes (Signed)
Received critical lab of anaerobic bottle grew gram positive cocci clusters at 2017 by Lelan Pons in lab. Mikey Bussing, MD notified at 2025.

## 2013-10-25 NOTE — Progress Notes (Addendum)
Subjective:  Pt seen and examined in AM. Overnight pt with respiratory distress requiring supplemental oxygenation via ventimask. Today he reports his pain has improved and now located more in the upper part of his chest. Reports passing gas but has not had bowel movement. No nausea, vomiting, or increased distension or worsening abdominal pain.     Objective: Vital signs in last 24 hours: Filed Vitals:   10/25/13 0432 10/25/13 0500 10/25/13 0600 10/25/13 0838  BP: 113/71 105/79 119/83 97/60  Pulse: 91 87 100 91  Temp: 97.6 F (36.4 C)   98.4 F (36.9 C)  TempSrc: Oral   Oral  Resp: 20 18 22 28   Height:      Weight:      SpO2: 93% 94% 92% 88%   Weight change:   Intake/Output Summary (Last 24 hours) at 10/25/13 0902 Last data filed at 10/25/13 0600  Gross per 24 hour  Intake 761.75 ml  Output    375 ml  Net 386.75 ml    Constitutional: He is oriented to person, place, and time. He appears well-developed and well-nourished. No distress.  Obese, no gynecomastia  HENT:  Head: Normocephalic and atraumatic.  Nose: Nose normal.  Mouth/Throat: Oropharynx is clear and moist. No oropharyngeal exudate.  Eyes: EOM are normal.  Neck: Normal range of motion. Neck supple.  Cardiovascular: Normal rate, regular rhythm and normal heart sounds. Exam reveals no friction rub.  No murmur heard.  Pulmonary/Chest: He has no wheezes. He has no rales. He exhibits tenderness (left sided ).  tachypneic  Abdominal: Bowel sounds are normal. He exhibits distension. There is tenderness (TTP of LUQ>LLQ). There is no guarding. A hernia (large umbilical hernia ) is present.  No caput medusa  No fluid wave Splenomegaly  Musculoskeletal: Normal range of motion. He exhibits no edema (trace b/l pedal edema).  Neurological: He is alert and oriented to person, place, and time.  Skin: Skin is warm and dry. No rash noted. He is not diaphoretic. No erythema. No pallor.  No spider angiomas or telangiectasias   Psychiatric: He has a normal mood and affect. His behavior is normal. Judgment and thought content normal.  No asterixis    Lab Results: Basic Metabolic Panel:  Recent Labs Lab 10/24/13 0842 10/25/13 0614  NA 130* 129*  K 4.0 4.7  CL 95* 95*  CO2 25 25  GLUCOSE 124* 121*  BUN 30* 33*  CREATININE 1.50* 1.52*  CALCIUM 9.3 8.4   Liver Function Tests:  Recent Labs Lab 10/24/13 0842 10/25/13 0614  AST 42* 30  ALT 29 22  ALKPHOS 119* 103  BILITOT 2.7* 3.1*  PROT 7.0 6.5  ALBUMIN 2.5* 2.2*    Recent Labs Lab 10/24/13 0842  LIPASE 32    Recent Labs Lab 10/24/13 0842  AMMONIA 46   CBC:  Recent Labs Lab 10/24/13 0842 10/25/13 0614  WBC 31.1* 36.5*  NEUTROABS 27.4*  --   HGB 14.2 13.6  HCT 35.1* 36.9*  MCV 99.7 103.9*  PLT 126* 129*   Cardiac Enzymes:  Recent Labs Lab 10/24/13 0842 10/25/13 0614  TROPONINI <0.30 <0.30   BNP:  Recent Labs Lab 10/24/13 0842  PROBNP 136.0*   D-Dimer: No results found for this basename: DDIMER,  in the last 168 hours CBG: No results found for this basename: GLUCAP,  in the last 168 hours Hemoglobin A1C: No results found for this basename: HGBA1C,  in the last 168 hours Fasting Lipid Panel: No results found for this  basename: CHOL, HDL, LDLCALC, TRIG, CHOLHDL, LDLDIRECT,  in the last 168 hours Thyroid Function Tests: No results found for this basename: TSH, T4TOTAL, FREET4, T3FREE, THYROIDAB,  in the last 168 hours Coagulation:  Recent Labs Lab 10/24/13 0842  LABPROT 17.7*  INR 1.50*   Anemia Panel: No results found for this basename: VITAMINB12, FOLATE, FERRITIN, TIBC, IRON, RETICCTPCT,  in the last 168 hours Urine Drug Screen: Drugs of Abuse     Component Value Date/Time   LABOPIA NONE DETECTED 10/24/2013 1240   COCAINSCRNUR NONE DETECTED 10/24/2013 1240   LABBENZ NONE DETECTED 10/24/2013 1240   AMPHETMU NONE DETECTED 10/24/2013 1240   THCU NONE DETECTED 10/24/2013 1240   LABBARB NONE DETECTED  10/24/2013 1240    Alcohol Level:  Recent Labs Lab 10/24/13 1721  ETH <11   Urinalysis:  Recent Labs Lab 10/24/13 1240  COLORURINE AMBER*  LABSPEC 1.024  PHURINE 5.0  GLUCOSEU NEGATIVE  HGBUR SMALL*  BILIRUBINUR SMALL*  KETONESUR 15*  PROTEINUR NEGATIVE  UROBILINOGEN 0.2  NITRITE NEGATIVE  LEUKOCYTESUR TRACE*     Micro Results: Recent Results (from the past 240 hour(s))  CULTURE, BLOOD (ROUTINE X 2)     Status: None   Collection Time    10/24/13  3:57 PM      Result Value Range Status   Specimen Description BLOOD LEFT ANTECUBITAL   Final   Special Requests BOTTLES DRAWN AEROBIC AND ANAEROBIC 10CC   Final   Culture  Setup Time     Final   Value: 10/24/2013 22:44     Performed at [facility]   Culture     Final   Value:        BLOOD CULTURE RECEIVED NO GROWTH TO DATE CULTURE WILL BE HELD FOR 5 DAYS BEFORE ISSUING A FINAL NEGATIVE REPORT     Performed at [facility]   Report Status PENDING   Incomplete  CULTURE, BLOOD (ROUTINE X 2)     Status: None   Collection Time    10/24/13  4:07 PM      Result Value Range Status   Specimen Description BLOOD HAND LEFT   Final   Special Requests BOTTLES DRAWN AEROBIC ONLY 6CC   Final   Culture  Setup Time     Final   Value: 10/24/2013 22:43     Performed at [facility]   Culture     Final   Value:        BLOOD CULTURE RECEIVED NO GROWTH TO DATE CULTURE WILL BE HELD FOR 5 DAYS BEFORE ISSUING A FINAL NEGATIVE REPORT     Performed at [facility]   Report Status PENDING   Incomplete  MRSA PCR SCREENING     Status: Abnormal   Collection Time    10/24/13  7:55 PM      Result Value Range Status   MRSA by PCR POSITIVE (*) NEGATIVE Final   Comment:            The GeneXpert MRSA Assay (FDA     approved for NASAL specimens     only), is one component of a     comprehensive MRSA colonization     surveillance program. It is not     intended to diagnose MRSA     infection nor to guide or      monitor treatment for     MRSA infections.     RESULT CALLED TO, READ BACK BY AND VERIFIED WITH:  YORK,M RN [id number] 10/24/13 MITCHELL,L   Studies/Results: Ct Abdomen Pelvis Wo Contrast  10/24/2013   CLINICAL DATA:  Pain left abdomen.  EXAM: CT ABDOMEN AND PELVIS WITHOUT CONTRAST  TECHNIQUE: Multidetector CT imaging of the abdomen and pelvis was performed following the standard protocol without intravenous contrast.  COMPARISON:  AP abdomen 10/24/2013.  FINDINGS: Liver is irregular suggesting cirrhosis. Splenomegaly. Prominent serpiginous structures are noted adjacent to the spleen suggesting varices. Mild peripancreatic edema noted. Mild pancreatitis cannot be excluded. Gallstones. The gallbladder is nondistended. No biliary distention. No pericholecystic fluid collection. Gallbladder wall thickness normal by CT.  Adrenals normal. No focal renal abnormality. No hydronephrosis. Bladder nondistended. Prostate slightly irregular contour appearing calcifications in prostate. No free pelvic fluid.  Left inguinal hernia. No bowel herniation. Left hydrocele. No significant adenopathy. Aorta normal caliber.  Large umbilical hernia with herniation of fat only. Edema noted within the herniated fat suggesting cellulitis. No bowel herniation is noted. There are slightly distended loops of small bowel throughout the abdomen. The colonic gas pattern is normal, there is no colonic distention. Sigmoid colonic diverticulosis noted. These findings suggest the presence of an adynamic ileus versus partial small bowel obstruction. Adynamic ileus can occur secondary to pancreatitis. No free air. No pneumatosis.  Bilateral pleural effusions. Bibasilar atelectasis and/or pneumonia. Borderline cardiomegaly. Degenerative changes lumbar spine.  IMPRESSION: 1. Findings suggesting cirrhosis with splenomegaly and varices. 2. Gallstones.  No biliary distention . 3. Mild peripancreatic edema, pancreatitis cannot be excluded. Probable  associated adynamic ileus as there is mild small bowel distention. Followup abdominal series to exclude developing small bowel obstruction suggested. 4. Umbilical hernia. Umbilical hernia is prominent with herniation of fat only. Edema noted in the herniated fat suggesting cellulitis. 5. Left inguinal hernia with herniation of fat only. Associated left hydrocele. 6. Bilateral pleural effusions and dense bibasilar atelectasis and/or pneumonia.   Electronically Signed   By: Maisie Fus  Register   On: 10/24/2013 14:12   Dg Chest 1 View  10/24/2013   CLINICAL DATA:  Shortness of breath.  EXAM: CHEST - 1 VIEW  FINDINGS: The patient has taken a markedly shallow inspiration. This limits evaluation of the chest. There is mild prominence of the interstitial markings particularly within the left hemi thorax accentuated by technique. Underlying component of mild edema versus a mild interstitial infiltrate, infectious or inflammatory, cannot be excluded. Repeat evaluation with deeper inspiration is recommended if clinically possible. The cardiac silhouette is poorly visualized. The visualized osseous structures are unremarkable.  IMPRESSION: 1. Markedly shallow inspiration 2. Interstitial findings likely secondary to technique nor underlying component of a mild interstitial infiltrate cannot be totally excluded particularly if clinically warranted. 3. Repeat evaluation recommended   Electronically Signed   By: Salome Holmes M.D.   On: 10/24/2013 10:00   Dg Abd 1 View  10/24/2013   CLINICAL DATA:  Abdominal pain.  EXAM: ABDOMEN - 1 VIEW  COMPARISON:  None.  FINDINGS: Air is seen within distended loops of large and small bowel. There is a paucity of distal bowel gas. Mild S-shaped scoliosis is appreciated within the lumbar spine. A smoothly marginated rounded area of increased density projects within the central aspect of the pelvic inlet. This may represent an internal device if clinically appropriate possibly external  artifact.  IMPRESSION: Nonspecific nonobstructive bowel gas pattern. An early or partial small bowel obstruction versus an ileus cannot be excluded. Surveillance evaluation recommended. Smoothly marginated area of increased density at the level of pelvic inlet as described above.   Electronically  Signed   By: Salome Holmes M.D.   On: 10/24/2013 09:58   Dg Chest Port 1 View  10/25/2013   CLINICAL DATA:  Upper abdominal pain, shortness of breath  EXAM: PORTABLE CHEST - 1 VIEW  COMPARISON:  10/24/2013  FINDINGS: There is a moderate layering left pleural effusion. There is bibasilar airspace disease which may reflect atelectasis versus developing pneumonia, more focal at the right lung base. There is bilateral interstitial thickening and prominence of the central pulmonary vasculature. There is no pneumothorax. Stable heart size. Unremarkable osseous structures.  IMPRESSION: Moderate layering left pleural effusion. Bibasilar airspace disease which may reflect atelectasis versus developing pneumonia, more focal at the right lung base.   Electronically Signed   By: Elige Ko   On: 10/25/2013 08:31   Medications: I have reviewed the patient's current medications. Scheduled Meds: . antiseptic oral rinse  15 mL Mouth Rinse q12n4p  . chlorhexidine  15 mL Mouth Rinse BID  . Chlorhexidine Gluconate Cloth  6 each Topical Q0600  . mupirocin ointment  1 application Nasal BID  . piperacillin-tazobactam (ZOSYN)  IV  3.375 g Intravenous Q8H  . sodium chloride  3 mL Intravenous Q12H  . vancomycin  750 mg Intravenous Q12H   Continuous Infusions: . sodium chloride 75 mL/hr at 10/24/13 2300   PRN Meds:.morphine injection, ondansetron (ZOFRAN) IV Assessment/Plan: Principal Problem:   Sepsis Active Problems:   Hep C w/o coma, chronic   Liver cirrhosis   Acute respiratory failure with hypoxia   Ileus, acute   Pancreatitis, acute   Large Pleural effusion on left   AKI (acute kidney injury)   Chronic kidney  disease (CKD), stage IV (severe)   Hyponatremia   Hypoalbuminemia   Hyperbilirubinemia   Essential hypertension, benign   Thrombocytopenia, chronic   Assessment: 62 year old man with past medical history of HTN, untreated chronic Hepatitis C infection, liver cirrhosis, and umbilical hernia who presented on 10/24/13 with left sided abdominal pain of 5 day duration.   Plan:   Acute Hypoxic Respiratory Failure  - Pt requiring supplemental oxygen (up to 6L) with ABG on 11/18 with normal pH and PaO2 63.7 on 0.50 % venturi mask. Most likely due to worsening left pleural effusion vs possible PNA vs empyema. Currently with PaO2/ FiO2 ratio of 127. -Appreciate PCCM --> bedside ultrasound unable to detect sufficient fluid to warrant thoracentesis---recommend IR directed thoracentesis --> pt consented to procedure for tomm if necessary - IV lasix 40 mg daily    Left sided large pleural effusion - Repeat CT abd on 11/18 with worsening large layering of  left pleural effusion. Etiology unknown, possible due to pancreatitis vs PNA.  -Appreciate PCCM --> bedside ultrasound unable to detect sufficient fluid to warrant thoracentesis---recommend IR directed thoracentesis --> pt consented to procedure for tomm if necessary -Obtain serum LDH   Ileus - Etiology unknown, most likely due to pancreatitis  -Consider NG tube if nausea/vomiting/distension - no evidence of varies from EGD in 2012  -Per GI consult --> ok to place NG tube -Limit narcotics  SIRS without source of infection - Pt with left sided upper quadrant pain with radiation to flank on admission with tachypnea (28-49) and leukocytosis (31K) with left shift (>20% bands) and neutrophilia (88%). Source of infection is currently unknown. Lipase within normal limits however on CT abdomen 11/17 there is presence of mild peripancreatic edema and bilateral pleural effusions concerning for mild pancreatitis. He reports no recent alcohol use. Pyelonephritis  possible due to history of  rigors and reported UTI 5 days ago (current UA w/o infection) with left sided CVA tenderness. There was no hydronephrosis on imaging. SBP could also be possible, however no ascites on CT abdomen and no fluid wave on exam for paracentesis. Lactic acid levels were normal and there was no AG on admission to suggest ischemic bowel. Pt with large umbilical hernia (reports has been present for long time) with herniation of fat only and questionable early or partial SBO vs adynamic ileus. Pt without fever and cough but dyspnea (with pain) and bilateral pleural effusions and dense bibasilar atelectasis with concern for pneumonia. There was evidence of diverticulosis and cholelithiasis on imaging however no evidence of acute cholecystitis or diverticulitis. Troponin(x1) was negative and 12-lead EKG was NSR without cardiac ischemic changes.  Repeat abd/CXR/CTabd with evidence of worsening left layering  pleural effusion, ileus, and continued mild to moderate inflammation of the head of the pancreas with secondary involvement of duodenum.  -NPO for bowel rest  -Obtain blood cultures x 2 --> NGTD -Start IV vancomycin & zosyn for broad spectrum coverage  -Obtain c. Diff --> start IV metronidazole -Morphine PRN pain ---> limit due to low BPs  -Appreciate PCCM consult --->  -Obtain lactic acid, procalcitonin levels  -Monitor BP   Compensated Liver Cirrhosis - Most likely secondary to past alcohol abuse and untreated viral hepatitis C infection. There was evidence of splenomegaly with varies on CT abdomen and distension on exam without other signs of stigmata of chronic liver disease. Pt's albumin is 2.5, bilirubin 2.4, INR 1.5, ascites that is medically controlled, and encephalopathy medically controlled with Child-Pugh Class C with life expectancy 1-3 years. CT abdomen with findings of cirrhosis with splenomegaly. Pt without ascites, fever, and encephalopathy, however with significant  abdominal pain and distension still concern for SBP. Pt with last reported alcoholic drink 1 month ago. Pt reports compliance with diuretic therapy at home. On exam there was distension however hard to appreciate ascites, thus paracentesis was not performed.  -Start IV lasix 40 mg daily   -Obtain ethanol levels --> neg -Records obtained from [facility] ---> EGD from 10/2011 without esophageal varices -Obtain indirect and direct bilirubin levels    Chronic Hepatitis C infection - Pt reports he was never treated. Complicated by liver cirrhosis. On admission pt with transaminitis (elevated AST 42). No evidence of rash, glomerulonephritis, or neuropathy to suggest cryoglobulinemia.  -Limit tylenol use -Obtain UDS --> neg  -Obtain HIV Ab --> NR -Obtain acute hepatitis panel   Hyponatremia - Baseline unknown. Most likely chronic due to liver cirrhosis. Hypotonic (276 serum osm) hypervolemic hyponatremia due to fluid overload. -IV lasix 40 mg daily    -Monitor BMP   Thrombocytopenia - Pt with splenomegaly and platelet count on admission of 126K (clumping present). Baseline platelet count unknown. No reports of recent bleeding.  -Continue to monitor CBC   Renal Insufficiency -  Pt presented with Cr of 1.5 with unknown baseline. Possible etiology is pre-renal azotemia (hypovolemia) vs ATN (sepsis). Post-renal causes less likely considering no hydronpehrosis on CT abdomen Pt without HTN, edema, or proteinuria on UA to suggest nephrotic syndrome or glomerulonephritis.  -Hold home 40 mg lasix & spironolactone 100 mg daily, lactulose 20 TID  -Avoid nephrotoxins (ACEi, ARB, NSAIDS, contrast)  -Continue to monitor BMP   Chronic Hepatitis C infection - Pt reports he was never treated. Complicated by liver cirrhosis. On admission pt with transaminitis (elevated AST 42). No evidence of rash, glomerulonephritis, or neuropathy to suggest cryoglobulinemia.  -  Limit tylenol use -Obtain UDS --> neg  -Obtain HIV  Ab --> NR -Obtain acute hepatitis panel    Diet: NPO  DVT PPx: SCD  Code: Full if >50% chance of survival      Dispo: Disposition is deferred at this time, awaiting improvement of current medical problems.  Anticipated discharge in approximately 5 day(s).   The patient does not have a current PCP (No Pcp Per Patient) and does need an [facility] hospital follow-up appointment after discharge.  The patient does not have transportation limitations that hinder transportation to clinic appointments.  .Services Needed at time of discharge: Y = Yes, Blank = No PT:   OT:   RN:   Equipment:   Other:     LOS: 1 day   Otis Brace, MD 10/25/2013, 9:02 AM

## 2013-10-25 NOTE — Progress Notes (Addendum)
Internal Medicine Teaching Service Night Float Progress Note S: Called by Deetta Perla RN around 2:30am that patient was in increased pain.  Responded and found patient to be gasping in pain.  He reported that he woke up and moved the wrong way causing his increased pain.  He is still unable to take deep breaths due to pain.  His pain is located in LUQ and left chest wall.  Patient reports passing gas  O:  HR 90 BP: 106/66 O2 Sat 91% on Venti mask at 6L  RR- 18-22 Gen: Resting in bed, moderate distress secondary to pain Chest: RRR, Reproducible left chest wall pain 5-6 ICS. Abd: Distended, Umbilical hernia, BS present, LUQ mild TTP Neuro: AAOx3.   A: Respiratory distress possibly secondary to left pleural effusion and pleuritic pain.  P:   - Obtained STAT ABG on Venti mask  Recent Labs Lab 10/25/13 0336  PHART 7.394  PCO2ART 44.7  PO2ART 63.9*  HCO3 26.7*  TCO2 28.1  O2SAT 91.4  - Obtain lactic acid  - Obtain troponin - Reviewed CT abd/pelvis with radiology, no obvious obstruction, perforation.  Left effusion could be contributing factor. - Briefly discussed CT with PCCM as well.  - Gave 2mg  Morphine in addition to his scheduled 2mg  Morphine, increased next sechduled dose to 4mg  Morphine, to be reassessed by day team. - Given patient's reduced pain patient is not acidotic on ABG, will continue with Venti mask and morphine.  If patient's respiratory status deteriorates, instructed patient that he may need to be placed on BiPAP ventilation.

## 2013-10-25 NOTE — Progress Notes (Signed)
@  approx. 1610 called MD regarding pt's abdominal pain 10/10 only slightly releaved by administration of 2mg  Morphine. Also mentioned pt's increase O2 requirements: 4L-->6L-->50% Venti. Orders received (1 time dose 2mg  Morphine ordered) and MD came to bedside. Additional labs ordered. Will continue to monitor and assess.

## 2013-10-26 ENCOUNTER — Inpatient Hospital Stay (HOSPITAL_COMMUNITY): Payer: Medicaid Other

## 2013-10-26 ENCOUNTER — Encounter (HOSPITAL_COMMUNITY): Payer: Self-pay | Admitting: Emergency Medicine

## 2013-10-26 DIAGNOSIS — J189 Pneumonia, unspecified organism: Secondary | ICD-10-CM

## 2013-10-26 DIAGNOSIS — B1920 Unspecified viral hepatitis C without hepatic coma: Secondary | ICD-10-CM

## 2013-10-26 DIAGNOSIS — J9600 Acute respiratory failure, unspecified whether with hypoxia or hypercapnia: Secondary | ICD-10-CM

## 2013-10-26 DIAGNOSIS — A4901 Methicillin susceptible Staphylococcus aureus infection, unspecified site: Secondary | ICD-10-CM

## 2013-10-26 DIAGNOSIS — J90 Pleural effusion, not elsewhere classified: Secondary | ICD-10-CM

## 2013-10-26 DIAGNOSIS — R7881 Bacteremia: Secondary | ICD-10-CM

## 2013-10-26 LAB — CBC WITH DIFFERENTIAL/PLATELET
Basophils Absolute: 0 10*3/uL (ref 0.0–0.1)
Eosinophils Relative: 1 % (ref 0–5)
HCT: 37.6 % — ABNORMAL LOW (ref 39.0–52.0)
Hemoglobin: 13.8 g/dL (ref 13.0–17.0)
Lymphocytes Relative: 6 % — ABNORMAL LOW (ref 12–46)
Lymphs Abs: 1.4 10*3/uL (ref 0.7–4.0)
MCH: 38 pg — ABNORMAL HIGH (ref 26.0–34.0)
MCHC: 36.7 g/dL — ABNORMAL HIGH (ref 30.0–36.0)
MCV: 103.6 fL — ABNORMAL HIGH (ref 78.0–100.0)
Monocytes Relative: 10 % (ref 3–12)
Neutrophils Relative %: 84 % — ABNORMAL HIGH (ref 43–77)
Platelets: 153 10*3/uL (ref 150–400)
RBC: 3.63 MIL/uL — ABNORMAL LOW (ref 4.22–5.81)
RDW: 15.1 % (ref 11.5–15.5)
WBC: 25 10*3/uL — ABNORMAL HIGH (ref 4.0–10.5)

## 2013-10-26 LAB — HEPATITIS A ANTIBODY, TOTAL: Hep A Total Ab: NONREACTIVE

## 2013-10-26 LAB — BASIC METABOLIC PANEL
BUN: 44 mg/dL — ABNORMAL HIGH (ref 6–23)
CO2: 28 mEq/L (ref 19–32)
Calcium: 7.9 mg/dL — ABNORMAL LOW (ref 8.4–10.5)
Chloride: 98 mEq/L (ref 96–112)
GFR calc Af Amer: 47 mL/min — ABNORMAL LOW (ref >90)
GFR calc Af Amer: 45 mL/min — ABNORMAL LOW (ref >90)
GFR calc non Af Amer: 41 mL/min — ABNORMAL LOW (ref >90)
GFR calc non Af Amer: 39 mL/min — ABNORMAL LOW (ref >90)
Glucose, Bld: 116 mg/dL — ABNORMAL HIGH (ref 70–99)
Glucose, Bld: 111 mg/dL — ABNORMAL HIGH (ref 70–99)
Potassium: 4.3 mEq/L (ref 3.5–5.1)
Sodium: 130 mEq/L — ABNORMAL LOW (ref 135–145)
Sodium: 131 mEq/L — ABNORMAL LOW (ref 135–145)

## 2013-10-26 LAB — HEPATITIS A ANTIBODY, IGM: Hep A IgM: NONREACTIVE

## 2013-10-26 LAB — HEPATIC FUNCTION PANEL
AST: 31 U/L (ref 0–37)
Alkaline Phosphatase: 114 U/L (ref 39–117)
Bilirubin, Direct: 1.9 mg/dL — ABNORMAL HIGH (ref 0.0–0.3)
Total Bilirubin: 3.1 mg/dL — ABNORMAL HIGH (ref 0.3–1.2)

## 2013-10-26 LAB — URINALYSIS, ROUTINE W REFLEX MICROSCOPIC
Glucose, UA: NEGATIVE mg/dL
Ketones, ur: NEGATIVE mg/dL
Leukocytes, UA: NEGATIVE
Nitrite: NEGATIVE
Protein, ur: NEGATIVE mg/dL
Specific Gravity, Urine: 1.023 (ref 1.005–1.030)
Urobilinogen, UA: 0.2 mg/dL (ref 0.0–1.0)

## 2013-10-26 LAB — BODY FLUID CELL COUNT WITH DIFFERENTIAL
Eos, Fluid: 0 %
Lymphs, Fluid: 9 %
Monocyte-Macrophage-Serous Fluid: 6 % — ABNORMAL LOW (ref 50–90)
Total Nucleated Cell Count, Fluid: 3951 cu mm — ABNORMAL HIGH (ref 0–1000)

## 2013-10-26 LAB — PROTEIN, BODY FLUID: Total protein, fluid: 3.9 g/dL

## 2013-10-26 LAB — HEPATITIS C ANTIBODY: HCV Ab: REACTIVE — AB

## 2013-10-26 LAB — LACTIC ACID, PLASMA: Lactic Acid, Venous: 1.9 mmol/L (ref 0.5–2.2)

## 2013-10-26 LAB — MAGNESIUM: Magnesium: 2.4 mg/dL (ref 1.5–2.5)

## 2013-10-26 LAB — LACTATE DEHYDROGENASE, PLEURAL OR PERITONEAL FLUID: LD, Fluid: 906 U/L — ABNORMAL HIGH (ref 3–23)

## 2013-10-26 LAB — LACTATE DEHYDROGENASE
LDH: 435 U/L — ABNORMAL HIGH (ref 94–250)
LDH: 317 U/L — ABNORMAL HIGH (ref 94–250)

## 2013-10-26 LAB — LIPID PANEL: Cholesterol: 58 mg/dL (ref 0–200)

## 2013-10-26 MED ORDER — MORPHINE SULFATE 2 MG/ML IJ SOLN
2.0000 mg | Freq: Once | INTRAMUSCULAR | Status: AC
  Administered 2013-10-26: 2 mg via INTRAVENOUS
  Filled 2013-10-26: qty 1

## 2013-10-26 MED ORDER — FUROSEMIDE 10 MG/ML IJ SOLN
INTRAMUSCULAR | Status: AC
  Filled 2013-10-26: qty 4

## 2013-10-26 MED ORDER — LACTULOSE ENEMA
300.0000 mL | Freq: Once | ORAL | Status: DC
  Filled 2013-10-26: qty 300

## 2013-10-26 MED ORDER — LACTULOSE 10 GM/15ML PO SOLN
20.0000 g | Freq: Three times a day (TID) | ORAL | Status: DC
  Administered 2013-10-26 – 2013-11-01 (×16): 20 g via ORAL
  Filled 2013-10-26 (×22): qty 30

## 2013-10-26 MED ORDER — CEFAZOLIN SODIUM-DEXTROSE 2-3 GM-% IV SOLR
2.0000 g | Freq: Three times a day (TID) | INTRAVENOUS | Status: DC
  Administered 2013-10-26 – 2013-10-27 (×3): 2 g via INTRAVENOUS
  Filled 2013-10-26 (×6): qty 50

## 2013-10-26 MED ORDER — FUROSEMIDE 10 MG/ML IJ SOLN
40.0000 mg | Freq: Every day | INTRAMUSCULAR | Status: DC
  Administered 2013-10-26 (×2): 40 mg via INTRAVENOUS
  Filled 2013-10-26: qty 4

## 2013-10-26 MED ORDER — MORPHINE SULFATE 4 MG/ML IJ SOLN: 4.0000 mg | Freq: Once | INTRAMUSCULAR | Status: DC

## 2013-10-26 NOTE — Progress Notes (Signed)
  Date: 10/26/2013  Patient name: [name] [name]  Medical record number: [id number]  Date of birth: 10-09-1951   This patient has been seen and the plan of care was discussed with the house staff. Please see their note for complete details. I concur with their findings.   Patient somewhat improved today.  ID has been consulted for S. 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  Now on Vancomycin and Unasyn.   Inez Catalina, MD 10/26/2013, 4:05 PM

## 2013-10-26 NOTE — Consult Note (Signed)
[facility]    Date of Admission:  10/24/2013  Date of Consult:  10/26/2013  Reason for Consult: Staphylococcus aureus bacteremia Referring Physician: Dr. Criselda Peaches   HPI: [name] [name] is an 62 y.o. male with past medical history significant for hepatitis C with cirrhosis, hypertensionWed of last week,  noted shaking chills and presented to the [place] in [place] where he was diagnosed with a UTI. He did not have any urinary symptoms at the time. He was given IVF and Ciprofloxacin which he reports that he did take. He reports that since being evaluated by a physician, he has had left sided chest and abdominal pain which has been worse with movement and taking a deep breath. He notes also lower left chest pain. He ultimately came to [facility] ED or worsening symptoms of fever tachypnea chest pain abdominal pain. On exam he was hypotensive and in renal failure. Blood cultures were obtained and he was started on vancomycin and Zosyn.  CT abdomen and pelis was done on the 17th which showed 1.  cirrhosis with splenomegaly and varices.  2. Gallstones. No biliary distention .  3. Mild peripancreatic edema, pancreatitis cannot be excluded.  Probable associated adynamic ileus as there is mild small bowel  distention. Followup abdominal series to exclude developing small  bowel obstruction suggested.  4. Umbilical hernia. Umbilical hernia is prominent with herniation  of fat only. Edema noted in the herniated fat suggesting cellulitis.  5. Left inguinal hernia with herniation of fat only. Associated left  hydrocele.  6. Bilateral pleural effusions and dense bibasilar atelectasis  and/or pneumonia.  They became concerned for pneumoperitoneum on the 18th and a repeat CT scan was done which showed  No  Pneumoperitoneum  But  Appearance of bowel most compatible with ileus,moderate inflammation about the head of the  pancreas with secondary involvement of the  duodenum.  And a  Large layering left pleural effusion that had increased with Complete left  lower lobe compressive atelectasis. Continued confluent right lung  base atelectasis versus consolidation.  5. Cirrhosis. Cholelithiasis. Sigmoid diverticulosis. Small  umbilical and left inguinal hernias containing fat and trace fluid  Since then his blood cultures have turned positive for Staphylococcus aureus in 2 of 2 bottles.  We are consult to workup this patient's staph Cox aureus bacteremia and assistance management.  Past Medical History  Diagnosis Date  . Cirrhosis   . Hepatitis C   . Inguinal hernia     "for a really long time"  . Hypertension     History reviewed. No pertinent past surgical history.ergies:   Allergies  Allergen Reactions  . Codeine Hives     Medications: I have reviewed patients current medications as documented in Epic Anti-infectives   Start     Dose/Rate Route Frequency Ordered Stop   10/26/13 1300  ceFAZolin (ANCEF) IVPB 2 g/50 mL premix     2 g 100 mL/hr over 30 Minutes Intravenous 3 times per day 10/26/13 1206     10/25/13 1230  metroNIDAZOLE (FLAGYL) IVPB 500 mg  Status:  Discontinued     500 mg 100 mL/hr over 60 Minutes Intravenous Every 8 hours 10/25/13 1123 10/26/13 0914   10/25/13 0600  vancomycin (VANCOCIN) IVPB 750 mg/150 ml premix     750 mg 150 mL/hr over 60 Minutes Intravenous Every 12 hours 10/24/13 1611     10/24/13 2200  piperacillin-tazobactam (ZOSYN) IVPB 3.375 g  Status:  Discontinued     3.375  g 12.5 mL/hr over 240 Minutes Intravenous 3 times per day 10/24/13 1611 10/26/13 1148   10/24/13 1600  vancomycin (VANCOCIN) IVPB 1000 mg/200 mL premix     1,000 mg 200 mL/hr over 60 Minutes Intravenous  Once 10/24/13 1548 10/24/13 1757   10/24/13 1600  piperacillin-tazobactam (ZOSYN) IVPB 4.5 g  Status:  Discontinued     4.5 g 200 mL/hr over 30 Minutes Intravenous  Once 10/24/13 1548 10/24/13 1555   10/24/13 1600   piperacillin-tazobactam (ZOSYN) IVPB 3.375 g     3.375 g 100 mL/hr over 30 Minutes Intravenous  Once 10/24/13 1556 10/24/13 1706      Social History:  reports that he has quit smoking. He does not have any smokeless tobacco history on file. He reports that he does not drink alcohol or use illicit drugs.  No family history on file.  As in HPI and primary teams notes otherwise 12 point review of systems is negative  Blood pressure 142/93, pulse 98, temperature 97.5 F (36.4 C), temperature source Oral, resp. rate 20, height 5\' 8"  (1.727 m), weight 209 lb 3.5 oz (94.9 kg), SpO2 91.00%. General: Alert and awake, oriented x3, not in any acute distress. HEENT: icteric sclera, pupils reactive to light and accommodation, EOMI, oropharynx clear and without exudate CVS regular rate, normal r,  no murmur rubs or gallops Chest: Diminished breath sounds at the bases, no wheezing, rales or rhonchi Abdomen: soft, protuberant umbilical hernia normal bowel sounds, Extremities: 2+ peripheral edema Skin: no rashes Neuro: nonfocal, strength and sensation intact   Results for orders placed during the hospital encounter of 10/24/13 (from the past 48 hour(s))  CULTURE, BLOOD (ROUTINE X 2)     Status: None   Collection Time    10/24/13  3:57 PM      Result Value Range   Specimen Description BLOOD LEFT ANTECUBITAL     Special Requests BOTTLES DRAWN AEROBIC AND ANAEROBIC 10CC     Culture  Setup Time       Value: 10/24/2013 22:44     Performed at [facility]   Culture       Value: STAPHYLOCOCCUS AUREUS     Note: RIFAMPIN AND GENTAMICIN SHOULD NOT BE USED AS SINGLE DRUGS FOR TREATMENT OF STAPH INFECTIONS.     Note: Gram Stain Report Called to,Read Back By and Verified With: Holland Commons ON 10/25/2013 AT 8:17P BY WILEJ     Performed at [facility]   Report Status PENDING    CULTURE, BLOOD (ROUTINE X 2)     Status: None   Collection Time    10/24/13  4:07 PM      Result Value Range    Specimen Description BLOOD HAND LEFT     Special Requests BOTTLES DRAWN AEROBIC ONLY 6CC     Culture  Setup Time       Value: 10/24/2013 22:43     Performed at [facility]   Culture       Value: GRAM POSITIVE COCCI IN CLUSTERS     Note: Gram Stain Report Called to,Read Back By and Verified With: SAMANTHA CLAXTON 10/26/13 0845 BY SMITHERSJ     Performed at [facility]   Report Status PENDING    ETHANOL     Status: None   Collection Time    10/24/13  5:21 PM      Result Value Range   Alcohol, Ethyl (B) <11  0 - 11 mg/dL   Comment:  LOWEST DETECTABLE LIMIT FOR     SERUM ALCOHOL IS 11 mg/dL     FOR MEDICAL PURPOSES ONLY  MRSA PCR SCREENING     Status: Abnormal   Collection Time    10/24/13  7:55 PM      Result Value Range   MRSA by PCR POSITIVE (*) NEGATIVE   Comment:            The GeneXpert MRSA Assay (FDA     approved for NASAL specimens     only), is one component of a     comprehensive MRSA colonization     surveillance program. It is not     intended to diagnose MRSA     infection nor to guide or     monitor treatment for     MRSA infections.     RESULT CALLED TO, READ BACK BY AND VERIFIED WITH:     YORK,M RN [id number] 10/24/13 MITCHELL,L  BLOOD GAS, ARTERIAL     Status: Abnormal   Collection Time    10/25/13  3:36 AM      Result Value Range   FIO2 0.50     Delivery systems VENTURI MASK     pH, Arterial 7.394  7.350 - 7.450   pCO2 arterial 44.7  35.0 - 45.0 mmHg   pO2, Arterial 63.9 (*) 80.0 - 100.0 mmHg   Bicarbonate 26.7 (*) 20.0 - 24.0 mEq/L   TCO2 28.1  0 - 100 mmol/L   Acid-Base Excess 2.3 (*) 0.0 - 2.0 mmol/L   O2 Saturation 91.4     Patient temperature 98.6     Collection site RIGHT RADIAL     Drawn by [phone]     Sample type ARTERIAL DRAW     Allens test (pass/fail) PASS  PASS  COMPREHENSIVE METABOLIC PANEL     Status: Abnormal   Collection Time    10/25/13  6:14 AM      Result Value Range   Sodium 129 (*) 135 - 145 mEq/L    Potassium 4.7  3.5 - 5.1 mEq/L   Chloride 95 (*) 96 - 112 mEq/L   CO2 25  19 - 32 mEq/L   Glucose, Bld 121 (*) 70 - 99 mg/dL   BUN 33 (*) 6 - 23 mg/dL   Creatinine, Ser 9.81 (*) 0.50 - 1.35 mg/dL   Calcium 8.4  8.4 - 19.1 mg/dL   Total Protein 6.5  6.0 - 8.3 g/dL   Albumin 2.2 (*) 3.5 - 5.2 g/dL   AST 30  0 - 37 U/L   ALT 22  0 - 53 U/L   Alkaline Phosphatase 103  39 - 117 U/L   Total Bilirubin 3.1 (*) 0.3 - 1.2 mg/dL   GFR calc non Af Amer 47 (*) >90 mL/min   GFR calc Af Amer 54 (*) >90 mL/min   Comment: (NOTE)     The eGFR has been calculated using the CKD EPI equation.     This calculation has not been validated in all clinical situations.     eGFR's persistently <90 mL/min signify possible Chronic Kidney     Disease.  CBC     Status: Abnormal   Collection Time    10/25/13  6:14 AM      Result Value Range   WBC 36.5 (*) 4.0 - 10.5 K/uL   RBC 3.55 (*) 4.22 - 5.81 MIL/uL   Hemoglobin 13.6  13.0 - 17.0 g/dL   HCT 47.8 (*) 29.5 -  52.0 %   MCV 103.9 (*) 78.0 - 100.0 fL   MCH 38.3 (*) 26.0 - 34.0 pg   MCHC 36.9 (*) 30.0 - 36.0 g/dL   Comment: CORRECTED FOR COLD AGGLUTININS   RDW 14.7  11.5 - 15.5 %   Platelets 129 (*) 150 - 400 K/uL  HIV ANTIBODY (ROUTINE TESTING)     Status: None   Collection Time    10/25/13  6:14 AM      Result Value Range   HIV NON REACTIVE  NON REACTIVE   Comment: Performed at [facility]  LACTIC ACID, PLASMA     Status: None   Collection Time    10/25/13  6:14 AM      Result Value Range   Lactic Acid, Venous 2.0  0.5 - 2.2 mmol/L  TROPONIN I     Status: None   Collection Time    10/25/13  6:14 AM      Result Value Range   Troponin I <0.30  <0.30 ng/mL   Comment:            Due to the release kinetics of cTnI,     a negative result within the first hours     of the onset of symptoms does not rule out     myocardial infarction with certainty.     If myocardial infarction is still suspected,     repeat the test at appropriate  intervals.  CBC     Status: Abnormal   Collection Time    10/25/13  3:00 PM      Result Value Range   WBC 37.0 (*) 4.0 - 10.5 K/uL   RBC 3.77 (*) 4.22 - 5.81 MIL/uL   Hemoglobin 13.7  13.0 - 17.0 g/dL   HCT 16.1 (*) 09.6 - 04.5 %   MCV 100.0  78.0 - 100.0 fL   MCH 36.6 (*) 26.0 - 34.0 pg   MCHC 36.6 (*) 30.0 - 36.0 g/dL   Comment: CORRECTED FOR COLD AGGLUTININS   RDW 14.9  11.5 - 15.5 %   Platelets 178  150 - 400 K/uL   Comment: REPEATED TO VERIFY  BASIC METABOLIC PANEL     Status: Abnormal   Collection Time    10/25/13  3:00 PM      Result Value Range   Sodium 129 (*) 135 - 145 mEq/L   Potassium 5.6 (*) 3.5 - 5.1 mEq/L   Chloride 96  96 - 112 mEq/L   CO2 21  19 - 32 mEq/L   Glucose, Bld 105 (*) 70 - 99 mg/dL   BUN 39 (*) 6 - 23 mg/dL   Creatinine, Ser 4.09 (*) 0.50 - 1.35 mg/dL   Calcium 8.3 (*) 8.4 - 10.5 mg/dL   GFR calc non Af Amer 42 (*) >90 mL/min   GFR calc Af Amer 48 (*) >90 mL/min   Comment: (NOTE)     The eGFR has been calculated using the CKD EPI equation.     This calculation has not been validated in all clinical situations.     eGFR's persistently <90 mL/min signify possible Chronic Kidney     Disease.  PROCALCITONIN     Status: None   Collection Time    10/25/13  3:03 PM      Result Value Range   Procalcitonin 1.78     Comment:            Interpretation:     PCT > 0.5 ng/mL  and <= 2 ng/mL:     Systemic infection (sepsis) is possible,     but other conditions are known to elevate     PCT as well.     (NOTE)             ICU PCT Algorithm               Non ICU PCT Algorithm        ----------------------------     ------------------------------             PCT < 0.25 ng/mL                 PCT < 0.1 ng/mL         Stopping of antibiotics            Stopping of antibiotics           strongly encouraged.               strongly encouraged.        ----------------------------     ------------------------------           PCT level decrease by               PCT  < 0.25 ng/mL           >= 80% from peak PCT           OR PCT 0.25 - 0.5 ng/mL          Stopping of antibiotics                                                 encouraged.         Stopping of antibiotics               encouraged.        ----------------------------     ------------------------------           PCT level decrease by              PCT >= 0.25 ng/mL           < 80% from peak PCT            AND PCT >= 0.5 ng/mL            Continuing antibiotics                                                  encouraged.           Continuing antibiotics                encouraged.        ----------------------------     ------------------------------         PCT level increase compared          PCT > 0.5 ng/mL             with peak PCT AND              PCT >= 0.5 ng/mL             Escalation of antibiotics  strongly encouraged.          Escalation of antibiotics            strongly encouraged.  HEPATITIS C ANTIBODY     Status: Abnormal   Collection Time    10/25/13  4:16 PM      Result Value Range   HCV Ab Reactive (*) NEGATIVE   Comment: (NOTE)                                                                               This test is for screening purposes only.  Reactive results should be     confirmed by an alternative method.  Suggest HCV Qualitative, PCR,     test code 47829.  Specimens will be stable for reflex testing up to 3     days after collection.     Performed at [facility]  HEPATITIS B SURFACE ANTIBODY     Status: None   Collection Time    10/25/13  4:16 PM      Result Value Range   Hep B S Ab NEGATIVE  NEGATIVE   Comment: Performed at [facility]  HEPATITIS A ANTIBODY, TOTAL     Status: None   Collection Time    10/25/13  4:16 PM      Result Value Range   Hep A Total Ab NON REACTIVE  NON REACTIVE   Comment: Performed at [facility]  HEPATITIS A ANTIBODY, IGM     Status: None   Collection Time     10/25/13  4:16 PM      Result Value Range   Hep A IgM NON REACTIVE  NON REACTIVE   Comment: Performed at [facility]  BASIC METABOLIC PANEL     Status: Abnormal   Collection Time    10/25/13 11:15 PM      Result Value Range   Sodium 130 (*) 135 - 145 mEq/L   Potassium 4.3  3.5 - 5.1 mEq/L   Comment: DELTA CHECK NOTED   Chloride 98  96 - 112 mEq/L   CO2 26  19 - 32 mEq/L   Glucose, Bld 116 (*) 70 - 99 mg/dL   BUN 42 (*) 6 - 23 mg/dL   Creatinine, Ser 5.62 (*) 0.50 - 1.35 mg/dL   Calcium 7.9 (*) 8.4 - 10.5 mg/dL   GFR calc non Af Amer 41 (*) >90 mL/min   GFR calc Af Amer 47 (*) >90 mL/min   Comment: (NOTE)     The eGFR has been calculated using the CKD EPI equation.     This calculation has not been validated in all clinical situations.     eGFR's persistently <90 mL/min signify possible Chronic Kidney     Disease.  LACTIC ACID, PLASMA     Status: None   Collection Time    10/25/13 11:15 PM      Result Value Range   Lactic Acid, Venous 1.9  0.5 - 2.2 mmol/L  LACTIC ACID, PLASMA     Status: Abnormal   Collection Time    10/26/13  4:45 AM      Result Value Range   Lactic Acid,  Venous 2.4 (*) 0.5 - 2.2 mmol/L  PROTIME-INR     Status: Abnormal   Collection Time    10/26/13  4:48 AM      Result Value Range   Prothrombin Time 17.7 (*) 11.6 - 15.2 seconds   INR 1.50 (*) 0.00 - 1.49  CBC WITH DIFFERENTIAL     Status: Abnormal   Collection Time    10/26/13  4:48 AM      Result Value Range   WBC 25.0 (*) 4.0 - 10.5 K/uL   RBC 3.63 (*) 4.22 - 5.81 MIL/uL   Hemoglobin 13.8  13.0 - 17.0 g/dL   HCT 16.1 (*) 09.6 - 04.5 %   MCV 103.6 (*) 78.0 - 100.0 fL   MCH 38.0 (*) 26.0 - 34.0 pg   MCHC 36.7 (*) 30.0 - 36.0 g/dL   RDW 40.9  81.1 - 91.4 %   Platelets 153  150 - 400 K/uL   Neutrophils Relative % 84 (*) 43 - 77 %   Neutro Abs 20.9 (*) 1.7 - 7.7 K/uL   Lymphocytes Relative 6 (*) 12 - 46 %   Lymphs Abs 1.4  0.7 - 4.0 K/uL   Monocytes Relative 10  3 - 12 %    Monocytes Absolute 2.5 (*) 0.1 - 1.0 K/uL   Eosinophils Relative 1  0 - 5 %   Eosinophils Absolute 0.2  0.0 - 0.7 K/uL   Basophils Relative 0  0 - 1 %   Basophils Absolute 0.0  0.0 - 0.1 K/uL  BASIC METABOLIC PANEL     Status: Abnormal   Collection Time    10/26/13  4:48 AM      Result Value Range   Sodium 131 (*) 135 - 145 mEq/L   Potassium 4.5  3.5 - 5.1 mEq/L   Chloride 97  96 - 112 mEq/L   CO2 28  19 - 32 mEq/L   Glucose, Bld 111 (*) 70 - 99 mg/dL   BUN 44 (*) 6 - 23 mg/dL   Creatinine, Ser 7.82 (*) 0.50 - 1.35 mg/dL   Calcium 7.9 (*) 8.4 - 10.5 mg/dL   GFR calc non Af Amer 39 (*) >90 mL/min   GFR calc Af Amer 45 (*) >90 mL/min   Comment: (NOTE)     The eGFR has been calculated using the CKD EPI equation.     This calculation has not been validated in all clinical situations.     eGFR's persistently <90 mL/min signify possible Chronic Kidney     Disease.  MAGNESIUM     Status: None   Collection Time    10/26/13  4:48 AM      Result Value Range   Magnesium 2.4  1.5 - 2.5 mg/dL  HEPATIC FUNCTION PANEL     Status: Abnormal   Collection Time    10/26/13  4:48 AM      Result Value Range   Total Protein 6.3  6.0 - 8.3 g/dL   Albumin 1.9 (*) 3.5 - 5.2 g/dL   AST 31  0 - 37 U/L   ALT 19  0 - 53 U/L   Alkaline Phosphatase 114  39 - 117 U/L   Total Bilirubin 3.1 (*) 0.3 - 1.2 mg/dL   Bilirubin, Direct 1.9 (*) 0.0 - 0.3 mg/dL   Indirect Bilirubin 1.2 (*) 0.3 - 0.9 mg/dL  LACTATE DEHYDROGENASE     Status: Abnormal   Collection Time    10/26/13  4:48 AM  Result Value Range   LDH 435 (*) 94 - 250 U/L  LIPID PANEL     Status: Abnormal   Collection Time    10/26/13  4:48 AM      Result Value Range   Cholesterol 58  0 - 200 mg/dL   Triglycerides 79  <161 mg/dL   HDL 7 (*) >09 mg/dL   Total CHOL/HDL Ratio 8.3     VLDL 16  0 - 40 mg/dL   LDL Cholesterol 35  0 - 99 mg/dL   Comment:            Total Cholesterol/HDL:CHD Risk     Coronary Heart Disease Risk Table                          Men   Women      1/2 Average Risk   3.4   3.3      Average Risk       5.0   4.4      2 X Average Risk   9.6   7.1      3 X Average Risk  23.4   11.0                Use the calculated Patient Ratio     above and the CHD Risk Table     to determine the patient's CHD Risk.                ATP III CLASSIFICATION (LDL):      <100     mg/dL   Optimal      604-540  mg/dL   Near or Above                        Optimal      130-159  mg/dL   Borderline      981-191  mg/dL   High      >478     mg/dL   Very High  URINALYSIS, ROUTINE W REFLEX MICROSCOPIC     Status: None   Collection Time    10/26/13  8:29 AM      Result Value Range   Color, Urine YELLOW  YELLOW   APPearance CLEAR  CLEAR   Specific Gravity, Urine 1.023  1.005 - 1.030   pH 5.0  5.0 - 8.0   Glucose, UA NEGATIVE  NEGATIVE mg/dL   Hgb urine dipstick NEGATIVE  NEGATIVE   Bilirubin Urine NEGATIVE  NEGATIVE   Ketones, ur NEGATIVE  NEGATIVE mg/dL   Protein, ur NEGATIVE  NEGATIVE mg/dL   Urobilinogen, UA 0.2  0.0 - 1.0 mg/dL   Nitrite NEGATIVE  NEGATIVE   Leukocytes, UA NEGATIVE  NEGATIVE   Comment: MICROSCOPIC NOT DONE ON URINES WITH NEGATIVE PROTEIN, BLOOD, LEUKOCYTES, NITRITE, OR GLUCOSE <1000 mg/dL.      Component Value Date/Time   SDES BLOOD HAND LEFT 10/24/2013 1607   SPECREQUEST BOTTLES DRAWN AEROBIC ONLY 6CC 10/24/2013 1607   CULT  Value: GRAM POSITIVE COCCI IN CLUSTERS Note: Gram Stain Report Called to,Read Back By and Verified With: SAMANTHA CLAXTON 10/26/13 0845 BY SMITHERSJ Performed at [facility] 10/24/2013 1607   REPTSTATUS PENDING 10/24/2013 1607   Ct Abdomen Pelvis Wo Contrast  10/25/2013   CLINICAL DATA:  62 year old male with worsening abdominal pain and distention. Earlier abdominal radiographs for which pneumoperitoneum was difficult to exclude. Recently diagnosed cirrhosis and possible mild pancreatitis. Initial  encounter.  EXAM: CT ABDOMEN AND PELVIS WITHOUT CONTRAST  TECHNIQUE:  Multidetector CT imaging of the abdomen and pelvis was performed following the standard protocol without intravenous contrast.  COMPARISON:  Abdominal radiographs from 0811 hr the same day. CT Abdomen and Pelvis 10/24/2013.  FINDINGS: No additional contrast administered for this scan.  Interval increased left pleural effusion, moderate to large and with complete left lower lobe atelectasis. No pericardial effusion. No right pleural effusion. Continued confluent right lower lobe basal segment atelectasis versus consolidation with air bronchograms. This finding might have stimulated pneumoperitoneum on the earlier radiographs.  Gynecomastia.  Stable visualized osseous structures. Disc and endplate degeneration in the lumbar spine.  No pneumoperitoneum.  Trace fluid in an otherwise fact containing left inguinal hernia is stable. No free fluid in the pelvis. Unremarkable bladder. Negative distal colon. Sigmoid diverticulosis but no active inflammation. Oral contrast has reached the proximal sigmoid colon. Negative left colon. Negative transverse colon. Negative right colon, cecum and appendix.  Featureless air and fluid filled small bowel loops with increased small bowel caliber since 10/24/2013, now up to 41 mm diameter. Still, no small bowel wall thickening. No abrupt small bowel transition point.  Nodular cirrhotic liver appears stable. Cholelithiasis. No pericholecystic inflammation. Stable spleen with suggestion of splint around all varices. Stable non contrast kidneys. No hydronephrosis or hydroureter.  The stomach now is decompressed. There is persistent inflammation in the lesser sac primarily about the head of the pancreas and duodenum (series 2, image 41). No associated fluid collection. No abdominal free fluid.  Umbilical hernia containing fat an a small volume of fluid is stable.  IMPRESSION: 1. Negative for pneumoperitoneum.  2. Appearance of bowel most compatible with ileus. Interval dilation of small  bowel up to 41 mm diameter, but contrast and gas throughout the colon and no small bowel transition point.  3. Continued mild to moderate inflammation about the head of the pancreas with secondary involvement of the duodenum. No fluid collection or free fluid.  4. Large layering left pleural effusion has increased. Complete left lower lobe compressive atelectasis. Continued confluent right lung base atelectasis versus consolidation.  5. Cirrhosis. Cholelithiasis. Sigmoid diverticulosis. Small umbilical and left inguinal hernias containing fat and trace fluid.   Electronically Signed   By: Augusto Gamble M.D.   On: 10/25/2013 11:33   Ct Abdomen Pelvis Wo Contrast  10/24/2013   CLINICAL DATA:  Pain left abdomen.  EXAM: CT ABDOMEN AND PELVIS WITHOUT CONTRAST  TECHNIQUE: Multidetector CT imaging of the abdomen and pelvis was performed following the standard protocol without intravenous contrast.  COMPARISON:  AP abdomen 10/24/2013.  FINDINGS: Liver is irregular suggesting cirrhosis. Splenomegaly. Prominent serpiginous structures are noted adjacent to the spleen suggesting varices. Mild peripancreatic edema noted. Mild pancreatitis cannot be excluded. Gallstones. The gallbladder is nondistended. No biliary distention. No pericholecystic fluid collection. Gallbladder wall thickness normal by CT.  Adrenals normal. No focal renal abnormality. No hydronephrosis. Bladder nondistended. Prostate slightly irregular contour appearing calcifications in prostate. No free pelvic fluid.  Left inguinal hernia. No bowel herniation. Left hydrocele. No significant adenopathy. Aorta normal caliber.  Large umbilical hernia with herniation of fat only. Edema noted within the herniated fat suggesting cellulitis. No bowel herniation is noted. There are slightly distended loops of small bowel throughout the abdomen. The colonic gas pattern is normal, there is no colonic distention. Sigmoid colonic diverticulosis noted. These findings suggest  the presence of an adynamic ileus versus partial small bowel obstruction. Adynamic ileus can occur secondary to  pancreatitis. No free air. No pneumatosis.  Bilateral pleural effusions. Bibasilar atelectasis and/or pneumonia. Borderline cardiomegaly. Degenerative changes lumbar spine.  IMPRESSION: 1. Findings suggesting cirrhosis with splenomegaly and varices. 2. Gallstones.  No biliary distention . 3. Mild peripancreatic edema, pancreatitis cannot be excluded. Probable associated adynamic ileus as there is mild small bowel distention. Followup abdominal series to exclude developing small bowel obstruction suggested. 4. Umbilical hernia. Umbilical hernia is prominent with herniation of fat only. Edema noted in the herniated fat suggesting cellulitis. 5. Left inguinal hernia with herniation of fat only. Associated left hydrocele. 6. Bilateral pleural effusions and dense bibasilar atelectasis and/or pneumonia.   Electronically Signed   By: Maisie Fus  Register   On: 10/24/2013 14:12   Dg Chest Port 1 View  10/26/2013   CLINICAL DATA:  Pneumonia  EXAM: PORTABLE CHEST - 1 VIEW  COMPARISON:  10/25/2013  FINDINGS: Moderately large left pleural effusion unchanged. Left lower lobe consolidation unchanged.  Progression of right lower lobe airspace disease which may be pneumonia or atelectasis. No significant effusion on the right. Negative for heart failure.  IMPRESSION: Moderate left effusion and left lower lobe consolidation unchanged. This suspicious for pneumonia and possibly empyema. Chest CT with contrast suggested for further evaluation.  Progression of right lower lobe atelectasis/ infiltrate.   Electronically Signed   By: Marlan Palau M.D.   On: 10/26/2013 10:24   Dg Chest Port 1 View  10/25/2013   CLINICAL DATA:  Upper abdominal pain, shortness of breath  EXAM: PORTABLE CHEST - 1 VIEW  COMPARISON:  10/24/2013  FINDINGS: There is a moderate layering left pleural effusion. There is bibasilar airspace disease  which may reflect atelectasis versus developing pneumonia, more focal at the right lung base. There is bilateral interstitial thickening and prominence of the central pulmonary vasculature. There is no pneumothorax. Stable heart size. Unremarkable osseous structures.  IMPRESSION: Moderate layering left pleural effusion. Bibasilar airspace disease which may reflect atelectasis versus developing pneumonia, more focal at the right lung base.   Electronically Signed   By: Elige Ko   On: 10/25/2013 08:31   Dg Abd Portable 2v  10/25/2013   CLINICAL DATA:  62 year old male with abdominal pain and distension. Fever and white count of unclear origin. Initial encounter. History of ventral abdominal hernia.  EXAM: PORTABLE ABDOMEN - 2 VIEW  COMPARISON:  CT Abdomen and Pelvis 10/24/2013 and earlier.  FINDINGS: Supine and left-side-down lateral decubitus views of the abdomen.  Widespread bowel gas, including distally in the rectum. Oral contrast has reached the colon as seen on the earlier CT. Continued gas-filled dilated small bowel loops in the lower abdomen, measuring up to 41 mm.  Unusual shaped gas and contrast collection in the left upper quadrant (arrow on the left-side-down decubitus view), but without gas layering under the right ribs. On the supine view, there is increased conspicuity of the walls of a small bowel loop located in the right upper quadrant, but this might be related to contrast coating of the loop.  Stable visualized osseous structures.  IMPRESSION: 1. Difficult to fully exclude the possibility of pneumoperitoneum. If clinical suspicion warrants, recommend repeat CT abdomen pelvis (non contrast).  2. Bowel gas pattern most suggestive of ileus. Retained oral contrast in the colon.  Study discussed by telephone with EMILY MULLEN on 10/25/2013 at 08:55 .   Electronically Signed   By: Augusto Gamble M.D.   On: 10/25/2013 09:01     Recent Results (from the past 720 hour(s))  CULTURE, BLOOD (  ROUTINE X  2)     Status: None   Collection Time    10/24/13  3:57 PM      Result Value Range Status   Specimen Description BLOOD LEFT ANTECUBITAL   Final   Special Requests BOTTLES DRAWN AEROBIC AND ANAEROBIC 10CC   Final   Culture  Setup Time     Final   Value: 10/24/2013 22:44     Performed at [facility]   Culture     Final   Value: STAPHYLOCOCCUS AUREUS     Note: RIFAMPIN AND GENTAMICIN SHOULD NOT BE USED AS SINGLE DRUGS FOR TREATMENT OF STAPH INFECTIONS.     Note: Gram Stain Report Called to,Read Back By and Verified With: Holland Commons ON 10/25/2013 AT 8:17P BY WILEJ     Performed at [facility]   Report Status PENDING   Incomplete  CULTURE, BLOOD (ROUTINE X 2)     Status: None   Collection Time    10/24/13  4:07 PM      Result Value Range Status   Specimen Description BLOOD HAND LEFT   Final   Special Requests BOTTLES DRAWN AEROBIC ONLY [facility]   Final   Culture  Setup Time     Final   Value: 10/24/2013 22:43     Performed at [facility]   Culture     Final   Value: GRAM POSITIVE COCCI IN CLUSTERS     Note: Gram Stain Report Called to,Read Back By and Verified With: SAMANTHA CLAXTON 10/26/13 0845 BY SMITHERSJ     Performed at [facility]   Report Status PENDING   Incomplete  MRSA PCR SCREENING     Status: Abnormal   Collection Time    10/24/13  7:55 PM      Result Value Range Status   MRSA by PCR POSITIVE (*) NEGATIVE Final   Comment:            The GeneXpert MRSA Assay (FDA     approved for NASAL specimens     only), is one component of a     comprehensive MRSA colonization     surveillance program. It is not     intended to diagnose MRSA     infection nor to guide or     monitor treatment for     MRSA infections.     RESULT CALLED TO, READ BACK BY AND VERIFIED WITH:     YORK,M RN [id number] 10/24/13 MITCHELL,L     Impression/Recommendation  # 1 Staphylococcus Aureus Bacteremia:  Given the loculated effusion + pneumonia in the lungs I  assumed at that site is either a source or a metastatic focus of infection of staph aureus.  Given his history of cirrhosis one would wonder about SBP that tracked into the lungs BUT he actually does NOT have much in the way of ascites and secondly Staphylococcus aureus is not at all atypical organism for SBP  He does NOT have any obvious ulceration or cutaneous source.  --I am changing his abx to vancomycin and cefazolin pending identification and sensitivities on the Staphylococcus aureus.  --I have repeated blood cultures  --He should not have any central line or PICC line placed for the next 72 hours or more to ensure that he is clearing his bacteremia  --- I agree with him greatly appreciate critical care medicine performing thoracocentesis for cell count differential and culture.  --- He will ideally need a transesophageal echocardiogram, though he  may need to be cleared by GI first given his hx of Esophageal varices!  --I will order 2D echo in the interim  --I will treat him for 6 weeks unless we can exclude endocarditis with TEE, and if we do exclude this I would stilll trate for 4 weeks  --Look for exam clues to other metastatic foci and evaluate these with highly sensitive testing such as MRI  I spent greater than 60 minutes with the patient including greater than 50% of time in face to face counsel of the patient and in coordination of their care.    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 Ninetta Lights is covering tomorrow for me.  [facility] [phone] (pager) [phone] (office) 10/26/2013, 1:45 PM  Paulette Blanch Dam 10/26/2013, 1:45 PM

## 2013-10-26 NOTE — Progress Notes (Signed)
PULMONARY  / CRITICAL CARE MEDICINE  Name: [name] [name] MRN: [id number] DOB: 1951/11/24    ADMISSION DATE:  10/24/2013 CONSULTATION DATE:  10/25/13  REFERRING MD :  Dr. Criselda Peaches PRIMARY SERVICE:  Internal medicine teaching service  CHIEF COMPLAINT:  Abdominal pain, distention, and shortness of breath   BRIEF PATIENT DESCRIPTION: 62 y.o. With HTN, untreated chronic hep C, cirrhosis and umbilical hernia was admitted 11/17 for hypovolemia and left sided abdominal pain secondary to a UTI diagnosed at [facility] in [place].  PCCM called to evaluate pleural effusion and ascites.  SIGNIFICANT EVENTS / STUDIES:  11/12 diagnosed with UTI at [facility] in [place] given Cipro 11/17 admitted 11/18 CT abd and pelvis Cirrhosis, large left pleural effusion, slightly dilated bowel loops no obstruction likely an ileus.  Cholelithiasis, and inflammation of pancreatic head. 11/19 Blood cultures show Staph, ID consulted   LINES / TUBES: PIV  CULTURES: 11/17 Blood x2 >> Gram + cocci in pairs, Staph  ANTIBIOTICS: 11/18 Vanc >> 11/18 Zosyn >>  PAST MEDICAL HISTORY :  Past Medical History  Diagnosis Date  . Cirrhosis   . Hepatitis C   . Inguinal hernia     "for a really long time"  . Hypertension    History reviewed. No pertinent past surgical history. Prior to Admission medications   Medication Sig Start Date End Date Taking? Authorizing Provider  ciprofloxacin (CIPRO) 500 MG tablet Take 500 mg by mouth 2 (two) times daily.   Yes Historical Provider, MD  furosemide (LASIX) 40 MG tablet Take 40 mg by mouth daily.   Yes Historical Provider, MD  ibuprofen (ADVIL,MOTRIN) 200 MG tablet Take 1,000 mg by mouth daily as needed for mild pain or moderate pain.   Yes Historical Provider, MD  lactulose (CHRONULAC) 10 GM/15ML solution Take 20 g by mouth 3 (three) times daily.   Yes Historical Provider, MD  potassium chloride SA (K-DUR,KLOR-CON) 20 MEQ tablet Take 20 mEq by mouth daily.   Yes Historical Provider, MD   spironolactone (ALDACTONE) 100 MG tablet Take 100 mg by mouth daily.   Yes Historical Provider, MD   Allergies  Allergen Reactions  . Codeine Hives    FAMILY HISTORY:  No family history on file. SOCIAL HISTORY:  reports that he has quit smoking. He does not have any smokeless tobacco history on file. He reports that he does not drink alcohol or use illicit drugs.  SUBJECTIVE: Tired and hungry but feels better.    VITAL SIGNS: Temp:  [97.3 F (36.3 C)-98.5 F (36.9 C)] 97.3 F (36.3 C) (11/19 0750) Pulse Rate:  [84-98] 98 (11/19 1200) Resp:  [14-28] 20 (11/19 1200) BP: (91-142)/(53-93) 142/93 mmHg (11/19 1200) SpO2:  [84 %-95 %] 91 % (11/19 1200) Weight:  [209 lb 3.5 oz (94.9 kg)] 209 lb 3.5 oz (94.9 kg) (11/19 0452)  PHYSICAL EXAMINATION: General:  Chronically ill appearing male, resting comfortably Neuro:  A&Ox4, appropriate conversation skills, moves extremitiesx4 HEENT:  Mild scleral icterus, MM moist and pink Neck:  No JVD Cardiovascular:  RRR, no M  Lungs:  Diminished breath sounds L>R, with scattered fine rales Abdomen:  +BS, mildly protuberant, tender palpation, large umbilical hernia, reducible Musculoskeletal:  No gross deformities, mild BLE swelling Skin:  Mildly jaundiced, no obvious breakdowns  Recent Labs Lab 10/25/13 1500 10/25/13 2315 10/26/13 0448  NA 129* 130* 131*  K 5.6* 4.3 4.5  CL 96 98 97  CO2 21 26 28   BUN 39* 42* 44*  CREATININE 1.67* 1.71* 1.76*  GLUCOSE  105* 116* 111*    Recent Labs Lab 10/25/13 0614 10/25/13 1500 10/26/13 0448  HGB 13.6 13.7 13.8  HCT 36.9* 37.7* 37.6*  WBC 36.5* 37.0* 25.0*  PLT 129* 178 153   Ct Abdomen Pelvis Wo Contrast  10/25/2013   CLINICAL DATA:  62 year old male with worsening abdominal pain and distention. Earlier abdominal radiographs for which pneumoperitoneum was difficult to exclude. Recently diagnosed cirrhosis and possible mild pancreatitis. Initial encounter.  EXAM: CT ABDOMEN AND PELVIS  WITHOUT CONTRAST  TECHNIQUE: Multidetector CT imaging of the abdomen and pelvis was performed following the standard protocol without intravenous contrast.  COMPARISON:  Abdominal radiographs from 0811 hr the same day. CT Abdomen and Pelvis 10/24/2013.  FINDINGS: No additional contrast administered for this scan.  Interval increased left pleural effusion, moderate to large and with complete left lower lobe atelectasis. No pericardial effusion. No right pleural effusion. Continued confluent right lower lobe basal segment atelectasis versus consolidation with air bronchograms. This finding might have stimulated pneumoperitoneum on the earlier radiographs.  Gynecomastia.  Stable visualized osseous structures. Disc and endplate degeneration in the lumbar spine.  No pneumoperitoneum.  Trace fluid in an otherwise fact containing left inguinal hernia is stable. No free fluid in the pelvis. Unremarkable bladder. Negative distal colon. Sigmoid diverticulosis but no active inflammation. Oral contrast has reached the proximal sigmoid colon. Negative left colon. Negative transverse colon. Negative right colon, cecum and appendix.  Featureless air and fluid filled small bowel loops with increased small bowel caliber since 10/24/2013, now up to 41 mm diameter. Still, no small bowel wall thickening. No abrupt small bowel transition point.  Nodular cirrhotic liver appears stable. Cholelithiasis. No pericholecystic inflammation. Stable spleen with suggestion of splint around all varices. Stable non contrast kidneys. No hydronephrosis or hydroureter.  The stomach now is decompressed. There is persistent inflammation in the lesser sac primarily about the head of the pancreas and duodenum (series 2, image 41). No associated fluid collection. No abdominal free fluid.  Umbilical hernia containing fat an a small volume of fluid is stable.  IMPRESSION: 1. Negative for pneumoperitoneum.  2. Appearance of bowel most compatible with ileus.  Interval dilation of small bowel up to 41 mm diameter, but contrast and gas throughout the colon and no small bowel transition point.  3. Continued mild to moderate inflammation about the head of the pancreas with secondary involvement of the duodenum. No fluid collection or free fluid.  4. Large layering left pleural effusion has increased. Complete left lower lobe compressive atelectasis. Continued confluent right lung base atelectasis versus consolidation.  5. Cirrhosis. Cholelithiasis. Sigmoid diverticulosis. Small umbilical and left inguinal hernias containing fat and trace fluid.   Electronically Signed   By: Augusto Gamble M.D.   On: 10/25/2013 11:33   Ct Abdomen Pelvis Wo Contrast  10/24/2013   CLINICAL DATA:  Pain left abdomen.  EXAM: CT ABDOMEN AND PELVIS WITHOUT CONTRAST  TECHNIQUE: Multidetector CT imaging of the abdomen and pelvis was performed following the standard protocol without intravenous contrast.  COMPARISON:  AP abdomen 10/24/2013.  FINDINGS: Liver is irregular suggesting cirrhosis. Splenomegaly. Prominent serpiginous structures are noted adjacent to the spleen suggesting varices. Mild peripancreatic edema noted. Mild pancreatitis cannot be excluded. Gallstones. The gallbladder is nondistended. No biliary distention. No pericholecystic fluid collection. Gallbladder wall thickness normal by CT.  Adrenals normal. No focal renal abnormality. No hydronephrosis. Bladder nondistended. Prostate slightly irregular contour appearing calcifications in prostate. No free pelvic fluid.  Left inguinal hernia. No bowel herniation. Left hydrocele.  No significant adenopathy. Aorta normal caliber.  Large umbilical hernia with herniation of fat only. Edema noted within the herniated fat suggesting cellulitis. No bowel herniation is noted. There are slightly distended loops of small bowel throughout the abdomen. The colonic gas pattern is normal, there is no colonic distention. Sigmoid colonic diverticulosis  noted. These findings suggest the presence of an adynamic ileus versus partial small bowel obstruction. Adynamic ileus can occur secondary to pancreatitis. No free air. No pneumatosis.  Bilateral pleural effusions. Bibasilar atelectasis and/or pneumonia. Borderline cardiomegaly. Degenerative changes lumbar spine.  IMPRESSION: 1. Findings suggesting cirrhosis with splenomegaly and varices. 2. Gallstones.  No biliary distention . 3. Mild peripancreatic edema, pancreatitis cannot be excluded. Probable associated adynamic ileus as there is mild small bowel distention. Followup abdominal series to exclude developing small bowel obstruction suggested. 4. Umbilical hernia. Umbilical hernia is prominent with herniation of fat only. Edema noted in the herniated fat suggesting cellulitis. 5. Left inguinal hernia with herniation of fat only. Associated left hydrocele. 6. Bilateral pleural effusions and dense bibasilar atelectasis and/or pneumonia.   Electronically Signed   By: Maisie Fus  Register   On: 10/24/2013 14:12   Dg Chest Port 1 View  10/26/2013   CLINICAL DATA:  Pneumonia  EXAM: PORTABLE CHEST - 1 VIEW  COMPARISON:  10/25/2013  FINDINGS: Moderately large left pleural effusion unchanged. Left lower lobe consolidation unchanged.  Progression of right lower lobe airspace disease which may be pneumonia or atelectasis. No significant effusion on the right. Negative for heart failure.  IMPRESSION: Moderate left effusion and left lower lobe consolidation unchanged. This suspicious for pneumonia and possibly empyema. Chest CT with contrast suggested for further evaluation.  Progression of right lower lobe atelectasis/ infiltrate.   Electronically Signed   By: Marlan Palau M.D.   On: 10/26/2013 10:24   Dg Chest Port 1 View  10/25/2013   CLINICAL DATA:  Upper abdominal pain, shortness of breath  EXAM: PORTABLE CHEST - 1 VIEW  COMPARISON:  10/24/2013  FINDINGS: There is a moderate layering left pleural effusion. There is  bibasilar airspace disease which may reflect atelectasis versus developing pneumonia, more focal at the right lung base. There is bilateral interstitial thickening and prominence of the central pulmonary vasculature. There is no pneumothorax. Stable heart size. Unremarkable osseous structures.  IMPRESSION: Moderate layering left pleural effusion. Bibasilar airspace disease which may reflect atelectasis versus developing pneumonia, more focal at the right lung base.   Electronically Signed   By: Elige Ko   On: 10/25/2013 08:31   Dg Abd Portable 2v  10/25/2013   CLINICAL DATA:  62 year old male with abdominal pain and distension. Fever and white count of unclear origin. Initial encounter. History of ventral abdominal hernia.  EXAM: PORTABLE ABDOMEN - 2 VIEW  COMPARISON:  CT Abdomen and Pelvis 10/24/2013 and earlier.  FINDINGS: Supine and left-side-down lateral decubitus views of the abdomen.  Widespread bowel gas, including distally in the rectum. Oral contrast has reached the colon as seen on the earlier CT. Continued gas-filled dilated small bowel loops in the lower abdomen, measuring up to 41 mm.  Unusual shaped gas and contrast collection in the left upper quadrant (arrow on the left-side-down decubitus view), but without gas layering under the right ribs. On the supine view, there is increased conspicuity of the walls of a small bowel loop located in the right upper quadrant, but this might be related to contrast coating of the loop.  Stable visualized osseous structures.  IMPRESSION: 1. Difficult to  fully exclude the possibility of pneumoperitoneum. If clinical suspicion warrants, recommend repeat CT abdomen pelvis (non contrast).  2. Bowel gas pattern most suggestive of ileus. Retained oral contrast in the colon.  Study discussed by telephone with EMILY MULLEN on 10/25/2013 at 08:55 .   Electronically Signed   By: Augusto Gamble M.D.   On: 10/25/2013 09:01    ASSESSMENT / PLAN: - Pleural effusion- with  loculated appearance on ultrasound  - Thora today  - Send for cytology, cells w/ diff, glucose, protein, LDH, and possibly triglycerides  - Serum protein for comparison  - Staph Bactermia  - Loculated effusion could be a possible source.  ID was consulted and this was communicated with ID.  - Cont Vanc and Zosyn  - Lactic acid slightly trending up would recheck in AM.  - Hypervolemic hyponatremia  - Cont KVO fluids  - Lasix 40 mg IV   Terri Piedra [facility] Physician Assistant Student 10/25/13, 12:11 PM  Today's summary:  Observed effusion again under ultrasound today.  A sizable fluid pocket was found.  Will proceed with thoracentesis.  Samples to be sent for above tests.  Possible that this is an empyema and could be possible source of staph bacteremia.  Will continue vanc and zosyn for now.  ID is following for bacteremia at this time.  Will more aggressively diurese today as pressures are no longer soft.  Patient can have regular diet if okay with primary team.    Patient seen and examined, agree with above note.  I dictated the care and orders written for this patient under my direction.  Alyson Reedy, MD [phone]

## 2013-10-26 NOTE — Progress Notes (Signed)
Nurse was contacted by [facility] labs; pt has positive blood culture for aerobic bottles gram with cocci in clusters.  MD has been notified

## 2013-10-26 NOTE — Progress Notes (Signed)
[name] Daily Rounding Note 10/26/2013, 10:19 AM  LOS: 2 days   SUBJECTIVE:       Has been passing lots of gas and very small stool this AM.  No abdominal pain, no nausea. No chills or fevers. Pt says team of MDs this AM said there was no plan for thoracentesis. Has not been out of bed.   OBJECTIVE:         Vital signs in last 24 hours:    Temp:  [97.3 F (36.3 C)-98.5 F (36.9 C)] 97.3 F (36.3 C) (11/19 0750) Pulse Rate:  [82-93] 86 (11/19 0750) Resp:  [14-28] 22 (11/19 0750) BP: (91-110)/(53-79) 95/64 mmHg (11/19 0750) SpO2:  [84 %-94 %] 93 % (11/19 0750) Weight:  [94.9 kg (209 lb 3.5 oz)] 94.9 kg (209 lb 3.5 oz) (11/19 0452)   General: chronically ill looking Heart: RRR.  No MRG Chest: clear bil, reduced sounds at bases Abdomen: soft, active BS, NT, ND.  No mass, no bruits.   Extremities: no CCE Neuro/Psych:  Pleasant, no confusion. No obvious deficits.   Intake/Output from previous day: 11/18 0701 - 11/19 0700 In: 353 [I.V.:3; IV Piggyback:350] Out: 750 [Urine:750]  Intake/Output this shift: Total I/O In: 23 [I.V.:23] Out: -   Lab Results:  Recent Labs  10/25/13 0614 10/25/13 1500 10/26/13 0448  WBC 36.5* 37.0* 25.0*  HGB 13.6 13.7 13.8  HCT 36.9* 37.7* 37.6*  PLT 129* 178 153   BMET  Recent Labs  10/25/13 1500 10/25/13 2315 10/26/13 0448  NA 129* 130* 131*  K 5.6* 4.3 4.5  CL 96 98 97  CO2 21 26 28   GLUCOSE 105* 116* 111*  BUN 39* 42* 44*  CREATININE 1.67* 1.71* 1.76*  CALCIUM 8.3* 7.9* 7.9*   LFT  Recent Labs  10/24/13 0842 10/25/13 0614 10/26/13 0448  PROT 7.0 6.5 6.3  ALBUMIN 2.5* 2.2* 1.9*  AST 42* 30 31  ALT 29 22 19   ALKPHOS 119* 103 114  BILITOT 2.7* 3.1* 3.1*  BILIDIR  --   --  1.9*  IBILI  --   --  1.2*   PT/INR  Recent Labs  10/24/13 0842 10/26/13 0448  LABPROT 17.7* 17.7*  INR 1.50* 1.50*   Hepatitis Panel   Ref. Range 10/25/2013 06:14 10/25/2013 16:16  Hep A IgM Latest Range: NON REACTIVE   NON  REACTIVE  Hep A Total Ab Latest Range: NON REACTIVE   NON REACTIVE  Hep B S Ab Latest Range: NEGATIVE   NEGATIVE  HCV Ab Latest Range: NEGATIVE   Reactive (A)  HIV Latest Range: NON REACTIVE  NON REACTIVE     Studies/Results: Ct Abdomen Pelvis Wo Contrast 10/25/2013   IMPRESSION: 1. Negative for pneumoperitoneum.  2. Appearance of bowel most compatible with ileus. Interval dilation of small bowel up to 41 mm diameter, but contrast and gas throughout the colon and no small bowel transition point.  3. Continued mild to moderate inflammation about the head of the pancreas with secondary involvement of the duodenum. No fluid collection or free fluid.  4. Large layering left pleural effusion has increased. Complete left lower lobe compressive atelectasis. Continued confluent right lung base atelectasis versus consolidation.  5. Cirrhosis. Cholelithiasis. Sigmoid diverticulosis. Small umbilical and left inguinal hernias containing fat and trace fluid.   Electronically Signed   By: Augusto Gamble M.D.   On: 10/25/2013 11:33   Ct Abdomen Pelvis Wo Contrast 10/24/2013     IMPRESSION: 1. Findings suggesting cirrhosis  with splenomegaly and varices. 2. Gallstones.  No biliary distention . 3. Mild peripancreatic edema, pancreatitis cannot be excluded. Probable associated adynamic ileus as there is mild small bowel distention. Followup abdominal series to exclude developing small bowel obstruction suggested. 4. Umbilical hernia. Umbilical hernia is prominent with herniation of fat only. Edema noted in the herniated fat suggesting cellulitis. 5. Left inguinal hernia with herniation of fat only. Associated left hydrocele. 6. Bilateral pleural effusions and dense bibasilar atelectasis and/or pneumonia.   Electronically Signed   By: Maisie Fus  Register   On: 10/24/2013 14:12   Dg Chest Port 1 View 10/25/2013   .  IMPRESSION: Moderate layering left pleural effusion. Bibasilar airspace disease which may reflect atelectasis  versus developing pneumonia, more focal at the right lung base.   Electronically Signed   By: Elige Ko   On: 10/25/2013 08:31   Dg Abd Portable 2v 10/25/2013   IMPRESSION: 1. Difficult to fully exclude the possibility of pneumoperitoneum. If clinical suspicion warrants, recommend repeat CT abdomen pelvis (non contrast).  2. Bowel gas pattern most suggestive of ileus. Retained oral contrast in the colon.  Study discussed by telephone with EMILY MULLEN on 10/25/2013 at 08:55 .   Electronically Signed   By: Augusto Gamble M.D.   On: 10/25/2013 09:01    ASSESMENT:  *  Hepatitis C, untreated.  Serologies confirm this. Hep B surface Ab negative.  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 protime is prolonged, suggesting some degree of synthetic dysfunction.  Pt not aware of any varices being seen on EGD in ~2013.  * Abdominal pain with suggestion of early SBO/ileus on CT scan. Pancreatic head inflammation with duodenal involvement but normal lipase level.  * Acute renal failure. Baseline renal function unknown.  Not improving.  * Pleural effusion. CCM, Dr Molli Knock raises ? If this is contributing to the LUQ pain. CCM states that bedside ultrasound unable to detect sufficient fluid to warrant thoracentesis.  *  GPC in clusters on 2/2 blood cultures.  On zosyn and Vanco  * Hyponatremia.  * Marked leukocytosis. No fever. Is this from lung process? Ileus ought not raise WBCs to this level. On Vanc IV, Flagyl IV and Zosyn.  * Thrombocytopenia. Not surprising given splenomegaly  *  Hypoalbuminemia, suspect protein calorie malnutrition.     PLAN  *  No plans for thoracentesis today per incomplete pulmonary note and pt report.   *  KUB this AM.  I suspect we could try clears and  Have written for sips of clears.  *  Check prealbumin in AM.   Jennye Moccasin  10/26/2013, 10:19 AM Pager: [phone]

## 2013-10-26 NOTE — Progress Notes (Signed)
ANTIBIOTIC CONSULT NOTE - INITIAL  Pharmacy Consult for cefazolin and vancomycin Indication: SA in blood  Allergies  Allergen Reactions  . Codeine Hives    Patient Measurements: Height: 5\' 8"  (172.7 cm) Weight: 209 lb 3.5 oz (94.9 kg) IBW/kg (Calculated) : 68.4   Vital Signs: Temp: 97.3 F (36.3 C) (11/19 0750) Temp src: Oral (11/19 0750) BP: 95/64 mmHg (11/19 0750) Pulse Rate: 86 (11/19 0750) Intake/Output from previous day: 11/18 0701 - 11/19 0700 In: 353 [I.V.:3; IV Piggyback:350] Out: 750 [Urine:750] Intake/Output from this shift: Total I/O In: 43 [I.V.:43] Out: -   Labs:  Recent Labs  10/25/13 0614 10/25/13 1500 10/25/13 2315 10/26/13 0448  WBC 36.5* 37.0*  --  25.0*  HGB 13.6 13.7  --  13.8  PLT 129* 178  --  153  CREATININE 1.52* 1.67* 1.71* 1.76*   Estimated Creatinine Clearance: 47.4 ml/min (by C-G formula based on Cr of 1.76). No results found for this basename: VANCOTROUGH, Leodis Binet, VANCORANDOM, GENTTROUGH, GENTPEAK, GENTRANDOM, TOBRATROUGH, TOBRAPEAK, TOBRARND, AMIKACINPEAK, AMIKACINTROU, AMIKACIN,  in the last 72 hours   Microbiology: Recent Results (from the past 720 hour(s))  CULTURE, BLOOD (ROUTINE X 2)     Status: None   Collection Time    10/24/13  3:57 PM      Result Value Range Status   Specimen Description BLOOD LEFT ANTECUBITAL   Final   Special Requests BOTTLES DRAWN AEROBIC AND ANAEROBIC 10CC   Final   Culture  Setup Time     Final   Value: 10/24/2013 22:44     Performed at [facility]   Culture     Final   Value: STAPHYLOCOCCUS AUREUS     Note: RIFAMPIN AND GENTAMICIN SHOULD NOT BE USED AS SINGLE DRUGS FOR TREATMENT OF STAPH INFECTIONS.     Note: Gram Stain Report Called to,Read Back By and Verified With: Holland Commons ON 10/25/2013 AT 8:17P BY WILEJ     Performed at [facility]   Report Status PENDING   Incomplete  CULTURE, BLOOD (ROUTINE X 2)     Status: None   Collection Time    10/24/13  4:07 PM   Result Value Range Status   Specimen Description BLOOD HAND LEFT   Final   Special Requests BOTTLES DRAWN AEROBIC ONLY [facility]   Final   Culture  Setup Time     Final   Value: 10/24/2013 22:43     Performed at [facility]   Culture     Final   Value: GRAM POSITIVE COCCI IN CLUSTERS     Note: Gram Stain Report Called to,Read Back By and Verified With: SAMANTHA CLAXTON 10/26/13 0845 BY SMITHERSJ     Performed at [facility]   Report Status PENDING   Incomplete  MRSA PCR SCREENING     Status: Abnormal   Collection Time    10/24/13  7:55 PM      Result Value Range Status   MRSA by PCR POSITIVE (*) NEGATIVE Final   Comment:            The GeneXpert MRSA Assay (FDA     approved for NASAL specimens     only), is one component of a     comprehensive MRSA colonization     surveillance program. It is not     intended to diagnose MRSA     infection nor to guide or     monitor treatment for     MRSA infections.  RESULT CALLED TO, READ BACK BY AND VERIFIED WITH:     YORK,M RN [id number] 10/24/13 MITCHELL,L    Assessment: 89 YOM with h/o cirrhosis admitted with left side abdominal pain. Was started on empiric therapy with vancomycin/Flagyl/Zosyn  for possible sepsis, now is on vancomycin and to start cefazolin for staph aureus in blood culture. WBC elevated at 25, pt is afebrile. SCr 1.76 (unknown baseline) with est CrCl ~67mL/min.  Goal of Therapy:  Vancomycin trough level 15-20 mcg/ml  Plan:  1. Continue vancomycin 750mg  IV q12h 2. Will obtain vancomycin trough tomorrow morning at 0530 to ensure dosing is correct 3. Start cefazolin 2g IV q8h 4. Follow cultures/sensitivities, ID recommendations, clinical progression, LOT, renal function  Ester Mabe D. Lexis Potenza, PharmD, BCPS Clinical Pharmacist Pager: [phone] 10/26/2013 12:05 PM

## 2013-10-26 NOTE — Progress Notes (Signed)
Nurse contacted MD to inform lab stated pt culture grew cell of microorganisms, will send off to determine what organism is growing.

## 2013-10-26 NOTE — Procedures (Signed)
Thoracentesis Procedure Note  Pre-operative Diagnosis: Left sided pleural effusion  Post-operative Diagnosis: same  Indications: Left sided pleural effusion  Procedure Details  Consent: Informed consent was obtained. Risks of the procedure were discussed including: infection, bleeding, pain, pneumothorax.  Under sterile conditions the patient was positioned. Betadine solution and sterile drapes were utilized.  1% plain lidocaine was used to anesthetize the 5 rib space. Fluid was obtained without any difficulties and minimal blood loss.  A dressing was applied to the wound and wound care instructions were provided.   Findings 1000 ml of cloudy yellow gold pleural fluid was obtained. A sample was sent to Pathology for cytogenetics, flow, and cell counts, as well as for infection analysis.  Complications:  None; patient tolerated the procedure well.          Condition: stable  Plan A follow up chest x-ray was ordered. Bed Rest for 0 hours. Tylenol 650 mg. for pain.  Attending Attestation: I was present and scrubbed for the entire procedure.  U/S used in performing procedure.  Alyson Reedy, M.D. [facility] Pulmonary/Critical Care Medicine. Pager: [phone]. After hours pager: [phone].

## 2013-10-26 NOTE — Progress Notes (Addendum)
Subjective:  Pt seen and examined in AM. No acute events overnight. Reports his pain has improved and now left sided laterally located chest pain that is present when he moves. He denies fever, chills, cough, nausea, and vomiting. His abdomen is less distended and soft.  He has not had a BM yet but passing gas easily. Reports swelling in legs also improved.        Objective: Vital signs in last 24 hours: Filed Vitals:   10/25/13 2100 10/25/13 2200 10/26/13 0022 10/26/13 0452  BP: 101/64 91/53 100/61 92/67  Pulse: 88 85 84 89  Temp:   98.4 F (36.9 C) 98.5 F (36.9 C)  TempSrc:   Oral Oral  Resp: 22 14 19 28   Height:      Weight:    94.9 kg (209 lb 3.5 oz)  SpO2: 94% 93% 93% 84%   Weight change: -0.7 kg (-1 lb 8.7 oz)  Intake/Output Summary (Last 24 hours) at 10/26/13 0720 Last data filed at 10/26/13 0454  Gross per 24 hour  Intake    353 ml  Output    750 ml  Net   -397 ml    Constitutional: He is oriented to person, place, and time. He appears well-developed and well-nourished. No distress.  Obese, no gynecomastia  HENT:  Head: Normocephalic and atraumatic.  Nose: Nose normal.  Mouth/Throat: Oropharynx is clear and moist. No oropharyngeal exudate.  Eyes: EOM are normal.  Neck: Normal range of motion. Neck supple.  Cardiovascular: Normal rate, regular rhythm and normal heart sounds. Exam reveals no friction rub.  No murmur heard.  Pulmonary/Chest: He has no wheezes. He has no rales. He exhibits tenderness (left sided later chest wall tachypneic  Abdominal: Bowel sounds are normal. Soft, minimal distension.  There is tenderness LUQ. There is no guarding. A hernia (large umbilical hernia ) is present.  No caput medusa  No fluid wave Splenomegaly  Musculoskeletal: Normal range of motion. He exhibits +1 b/l LE  edema   Neurological: He is alert and oriented to person, place, and time.  Skin: Skin is warm and dry. No rash noted. He is not diaphoretic. No  erythema. No pallor.  No spider angiomas or telangiectasias  Psychiatric: He has a normal mood and affect. His behavior is normal. Judgment and thought content normal.  No asterixis    Lab Results: Basic Metabolic Panel:  Recent Labs Lab 10/25/13 2315 10/26/13 0448  NA 130* 131*  K 4.3 4.5  CL 98 97  CO2 26 28  GLUCOSE 116* 111*  BUN 42* 44*  CREATININE 1.71* 1.76*  CALCIUM 7.9* 7.9*  MG  --  2.4   Liver Function Tests:  Recent Labs Lab 10/25/13 0614 10/26/13 0448  AST 30 31  ALT 22 19  ALKPHOS 103 114  BILITOT 3.1* 3.1*  PROT 6.5 6.3  ALBUMIN 2.2* 1.9*    Recent Labs Lab 10/24/13 0842  LIPASE 32    Recent Labs Lab 10/24/13 0842  AMMONIA 46   CBC:  Recent Labs Lab 10/24/13 0842  10/25/13 1500 10/26/13 0448  WBC 31.1*  < > 37.0* 25.0*  NEUTROABS 27.4*  --   --  20.9*  HGB 14.2  < > 13.7 13.8  HCT 35.1*  < > 37.7* 37.6*  MCV 99.7  < > 100.0 103.6*  PLT 126*  < > 178 153  < > = values in this interval not displayed. Cardiac Enzymes:  Recent Labs Lab 10/24/13  4540 10/25/13 0614  TROPONINI <0.30 <0.30   BNP:  Recent Labs Lab 10/24/13 0842  PROBNP 136.0*   D-Dimer: No results found for this basename: DDIMER,  in the last 168 hours CBG: No results found for this basename: GLUCAP,  in the last 168 hours Hemoglobin A1C: No results found for this basename: HGBA1C,  in the last 168 hours Fasting Lipid Panel:  Recent Labs Lab 10/26/13 0448  CHOL 58  HDL 7*  LDLCALC 35  TRIG 79  CHOLHDL 8.3   Thyroid Function Tests: No results found for this basename: TSH, T4TOTAL, FREET4, T3FREE, THYROIDAB,  in the last 168 hours Coagulation:  Recent Labs Lab 10/24/13 0842 10/26/13 0448  LABPROT 17.7* 17.7*  INR 1.50* 1.50*   Anemia Panel: No results found for this basename: VITAMINB12, FOLATE, FERRITIN, TIBC, IRON, RETICCTPCT,  in the last 168 hours Urine Drug Screen: Drugs of Abuse     Component Value Date/Time   LABOPIA NONE  DETECTED 10/24/2013 1240   COCAINSCRNUR NONE DETECTED 10/24/2013 1240   LABBENZ NONE DETECTED 10/24/2013 1240   AMPHETMU NONE DETECTED 10/24/2013 1240   THCU NONE DETECTED 10/24/2013 1240   LABBARB NONE DETECTED 10/24/2013 1240    Alcohol Level:  Recent Labs Lab 10/24/13 1721  ETH <11   Urinalysis:  Recent Labs Lab 10/24/13 1240  COLORURINE AMBER*  LABSPEC 1.024  PHURINE 5.0  GLUCOSEU NEGATIVE  HGBUR SMALL*  BILIRUBINUR SMALL*  KETONESUR 15*  PROTEINUR NEGATIVE  UROBILINOGEN 0.2  NITRITE NEGATIVE  LEUKOCYTESUR TRACE*     Micro Results: Recent Results (from the past 240 hour(s))  CULTURE, BLOOD (ROUTINE X 2)     Status: None   Collection Time    10/24/13  3:57 PM      Result Value Range Status   Specimen Description BLOOD LEFT ANTECUBITAL   Final   Special Requests BOTTLES DRAWN AEROBIC AND ANAEROBIC 10CC   Final   Culture  Setup Time     Final   Value: 10/24/2013 22:44     Performed at [facility]   Culture     Final   Value: GRAM POSITIVE COCCI IN CLUSTERS     Note: Gram Stain Report Called to,Read Back By and Verified With: Holland Commons ON 10/25/2013 AT 8:17P BY WILEJ     Performed at [facility]   Report Status PENDING   Incomplete  CULTURE, BLOOD (ROUTINE X 2)     Status: None   Collection Time    10/24/13  4:07 PM      Result Value Range Status   Specimen Description BLOOD HAND LEFT   Final   Special Requests BOTTLES DRAWN AEROBIC ONLY 6CC   Final   Culture  Setup Time     Final   Value: 10/24/2013 22:43     Performed at [facility]   Culture     Final   Value:        BLOOD CULTURE RECEIVED NO GROWTH TO DATE CULTURE WILL BE HELD FOR 5 DAYS BEFORE ISSUING A FINAL NEGATIVE REPORT     Performed at [facility]   Report Status PENDING   Incomplete  MRSA PCR SCREENING     Status: Abnormal   Collection Time    10/24/13  7:55 PM      Result Value Range Status   MRSA by PCR POSITIVE (*) NEGATIVE Final   Comment:             The GeneXpert  MRSA Assay (FDA     approved for NASAL specimens     only), is one component of a     comprehensive MRSA colonization     surveillance program. It is not     intended to diagnose MRSA     infection nor to guide or     monitor treatment for     MRSA infections.     RESULT CALLED TO, READ BACK BY AND VERIFIED WITH:     YORK,M RN [id number] 10/24/13 MITCHELL,L   Studies/Results: Ct Abdomen Pelvis Wo Contrast  10/25/2013   CLINICAL DATA:  62 year old male with worsening abdominal pain and distention. Earlier abdominal radiographs for which pneumoperitoneum was difficult to exclude. Recently diagnosed cirrhosis and possible mild pancreatitis. Initial encounter.  EXAM: CT ABDOMEN AND PELVIS WITHOUT CONTRAST  TECHNIQUE: Multidetector CT imaging of the abdomen and pelvis was performed following the standard protocol without intravenous contrast.  COMPARISON:  Abdominal radiographs from 0811 hr the same day. CT Abdomen and Pelvis 10/24/2013.  FINDINGS: No additional contrast administered for this scan.  Interval increased left pleural effusion, moderate to large and with complete left lower lobe atelectasis. No pericardial effusion. No right pleural effusion. Continued confluent right lower lobe basal segment atelectasis versus consolidation with air bronchograms. This finding might have stimulated pneumoperitoneum on the earlier radiographs.  Gynecomastia.  Stable visualized osseous structures. Disc and endplate degeneration in the lumbar spine.  No pneumoperitoneum.  Trace fluid in an otherwise fact containing left inguinal hernia is stable. No free fluid in the pelvis. Unremarkable bladder. Negative distal colon. Sigmoid diverticulosis but no active inflammation. Oral contrast has reached the proximal sigmoid colon. Negative left colon. Negative transverse colon. Negative right colon, cecum and appendix.  Featureless air and fluid filled small bowel loops with increased small bowel caliber  since 10/24/2013, now up to 41 mm diameter. Still, no small bowel wall thickening. No abrupt small bowel transition point.  Nodular cirrhotic liver appears stable. Cholelithiasis. No pericholecystic inflammation. Stable spleen with suggestion of splint around all varices. Stable non contrast kidneys. No hydronephrosis or hydroureter.  The stomach now is decompressed. There is persistent inflammation in the lesser sac primarily about the head of the pancreas and duodenum (series 2, image 41). No associated fluid collection. No abdominal free fluid.  Umbilical hernia containing fat an a small volume of fluid is stable.  IMPRESSION: 1. Negative for pneumoperitoneum.  2. Appearance of bowel most compatible with ileus. Interval dilation of small bowel up to 41 mm diameter, but contrast and gas throughout the colon and no small bowel transition point.  3. Continued mild to moderate inflammation about the head of the pancreas with secondary involvement of the duodenum. No fluid collection or free fluid.  4. Large layering left pleural effusion has increased. Complete left lower lobe compressive atelectasis. Continued confluent right lung base atelectasis versus consolidation.  5. Cirrhosis. Cholelithiasis. Sigmoid diverticulosis. Small umbilical and left inguinal hernias containing fat and trace fluid.   Electronically Signed   By: Augusto Gamble M.D.   On: 10/25/2013 11:33   Ct Abdomen Pelvis Wo Contrast  10/24/2013   CLINICAL DATA:  Pain left abdomen.  EXAM: CT ABDOMEN AND PELVIS WITHOUT CONTRAST  TECHNIQUE: Multidetector CT imaging of the abdomen and pelvis was performed following the standard protocol without intravenous contrast.  COMPARISON:  AP abdomen 10/24/2013.  FINDINGS: Liver is irregular suggesting cirrhosis. Splenomegaly. Prominent serpiginous structures are noted adjacent to the spleen suggesting varices. Mild peripancreatic edema noted. Mild  pancreatitis cannot be excluded. Gallstones. The gallbladder is  nondistended. No biliary distention. No pericholecystic fluid collection. Gallbladder wall thickness normal by CT.  Adrenals normal. No focal renal abnormality. No hydronephrosis. Bladder nondistended. Prostate slightly irregular contour appearing calcifications in prostate. No free pelvic fluid.  Left inguinal hernia. No bowel herniation. Left hydrocele. No significant adenopathy. Aorta normal caliber.  Large umbilical hernia with herniation of fat only. Edema noted within the herniated fat suggesting cellulitis. No bowel herniation is noted. There are slightly distended loops of small bowel throughout the abdomen. The colonic gas pattern is normal, there is no colonic distention. Sigmoid colonic diverticulosis noted. These findings suggest the presence of an adynamic ileus versus partial small bowel obstruction. Adynamic ileus can occur secondary to pancreatitis. No free air. No pneumatosis.  Bilateral pleural effusions. Bibasilar atelectasis and/or pneumonia. Borderline cardiomegaly. Degenerative changes lumbar spine.  IMPRESSION: 1. Findings suggesting cirrhosis with splenomegaly and varices. 2. Gallstones.  No biliary distention . 3. Mild peripancreatic edema, pancreatitis cannot be excluded. Probable associated adynamic ileus as there is mild small bowel distention. Followup abdominal series to exclude developing small bowel obstruction suggested. 4. Umbilical hernia. Umbilical hernia is prominent with herniation of fat only. Edema noted in the herniated fat suggesting cellulitis. 5. Left inguinal hernia with herniation of fat only. Associated left hydrocele. 6. Bilateral pleural effusions and dense bibasilar atelectasis and/or pneumonia.   Electronically Signed   By: Maisie Fus  Register   On: 10/24/2013 14:12   Dg Chest 1 View  10/24/2013   CLINICAL DATA:  Shortness of breath.  EXAM: CHEST - 1 VIEW  FINDINGS: The patient has taken a markedly shallow inspiration. This limits evaluation of the chest. There is  mild prominence of the interstitial markings particularly within the left hemi thorax accentuated by technique. Underlying component of mild edema versus a mild interstitial infiltrate, infectious or inflammatory, cannot be excluded. Repeat evaluation with deeper inspiration is recommended if clinically possible. The cardiac silhouette is poorly visualized. The visualized osseous structures are unremarkable.  IMPRESSION: 1. Markedly shallow inspiration 2. Interstitial findings likely secondary to technique nor underlying component of a mild interstitial infiltrate cannot be totally excluded particularly if clinically warranted. 3. Repeat evaluation recommended   Electronically Signed   By: Salome Holmes M.D.   On: 10/24/2013 10:00   Dg Abd 1 View  10/24/2013   CLINICAL DATA:  Abdominal pain.  EXAM: ABDOMEN - 1 VIEW  COMPARISON:  None.  FINDINGS: Air is seen within distended loops of large and small bowel. There is a paucity of distal bowel gas. Mild S-shaped scoliosis is appreciated within the lumbar spine. A smoothly marginated rounded area of increased density projects within the central aspect of the pelvic inlet. This may represent an internal device if clinically appropriate possibly external artifact.  IMPRESSION: Nonspecific nonobstructive bowel gas pattern. An early or partial small bowel obstruction versus an ileus cannot be excluded. Surveillance evaluation recommended. Smoothly marginated area of increased density at the level of pelvic inlet as described above.   Electronically Signed   By: Salome Holmes M.D.   On: 10/24/2013 09:58   Dg Chest Port 1 View  10/25/2013   CLINICAL DATA:  Upper abdominal pain, shortness of breath  EXAM: PORTABLE CHEST - 1 VIEW  COMPARISON:  10/24/2013  FINDINGS: There is a moderate layering left pleural effusion. There is bibasilar airspace disease which may reflect atelectasis versus developing pneumonia, more focal at the right lung base. There is bilateral  interstitial thickening and  prominence of the central pulmonary vasculature. There is no pneumothorax. Stable heart size. Unremarkable osseous structures.  IMPRESSION: Moderate layering left pleural effusion. Bibasilar airspace disease which may reflect atelectasis versus developing pneumonia, more focal at the right lung base.   Electronically Signed   By: Elige Ko   On: 10/25/2013 08:31   Dg Abd Portable 2v  10/25/2013   CLINICAL DATA:  62 year old male with abdominal pain and distension. Fever and white count of unclear origin. Initial encounter. History of ventral abdominal hernia.  EXAM: PORTABLE ABDOMEN - 2 VIEW  COMPARISON:  CT Abdomen and Pelvis 10/24/2013 and earlier.  FINDINGS: Supine and left-side-down lateral decubitus views of the abdomen.  Widespread bowel gas, including distally in the rectum. Oral contrast has reached the colon as seen on the earlier CT. Continued gas-filled dilated small bowel loops in the lower abdomen, measuring up to 41 mm.  Unusual shaped gas and contrast collection in the left upper quadrant (arrow on the left-side-down decubitus view), but without gas layering under the right ribs. On the supine view, there is increased conspicuity of the walls of a small bowel loop located in the right upper quadrant, but this might be related to contrast coating of the loop.  Stable visualized osseous structures.  IMPRESSION: 1. Difficult to fully exclude the possibility of pneumoperitoneum. If clinical suspicion warrants, recommend repeat CT abdomen pelvis (non contrast).  2. Bowel gas pattern most suggestive of ileus. Retained oral contrast in the colon.  Study discussed by telephone with EMILY MULLEN on 10/25/2013 at 08:55 .   Electronically Signed   By: Augusto Gamble M.D.   On: 10/25/2013 09:01   Medications: I have reviewed the patient's current medications. Scheduled Meds: . antiseptic oral rinse  15 mL Mouth Rinse q12n4p  . chlorhexidine  15 mL Mouth Rinse BID  .  Chlorhexidine Gluconate Cloth  6 each Topical Q0600  . furosemide  40 mg Intravenous Daily  . metronidazole  500 mg Intravenous Q8H  . mupirocin ointment  1 application Nasal BID  . piperacillin-tazobactam (ZOSYN)  IV  3.375 g Intravenous Q8H  . sodium chloride  3 mL Intravenous Q12H  . vancomycin  750 mg Intravenous Q12H   Continuous Infusions: . sodium chloride     PRN Meds:.morphine injection, ondansetron (ZOFRAN) IV Assessment/Plan: Principal Problem:   Sepsis Active Problems:   Hep C w/o coma, chronic   Liver cirrhosis   Acute respiratory failure with hypoxia   Ileus, acute   Pancreatitis, acute   Large Pleural effusion on left   AKI (acute kidney injury)   Chronic kidney disease (CKD), stage IV (severe)   Hyponatremia   Hypoalbuminemia   Hyperbilirubinemia   Essential hypertension, benign   Thrombocytopenia, chronic   Assessment: 62 year old man with past medical history of HTN, untreated chronic Hepatitis C infection, liver cirrhosis, and umbilical hernia who presented on 10/24/13 with left sided abdominal pain of 5 day duration.   Plan:    Sepsis 2/2  Staph Aureus bacteremia  thought to be due to  PNA complicated with left loculated pleural effusion & acute hypoxic respiratory failure - Pt presented with left sided upper quadrant abdominal and chest pain with tachypnea (28-49) and leukocytosis (31K) with left shift (>20% bands) and neutrophilia (88%). Lipase within normal limits however on CT abdomen 11/17 there is presence of mild peripancreatic edema and large left pleural effusion concerning for pancreatitis. He reported no recent alcohol use and ethanol levels were negative. Lipid panel also normal. Pt  requiring supplemental oxygen with ABG on 11/18 with normal pH and PaO2 63.7 on 0.50 % venturi mask most likely due to worsening left pleural effusion due to  empyema.  Pt without fever and cough but dyspnea (with pain) and large layering pleural effusion and dense  bibasilar atelectasis concerning for pneumonia and empyema. Per PCCM , bedside ultrasound unable to detect sufficient fluid to warrant thoracentesis. Repeat abd/CXR/CTabd on 11/18 with evidence of worsening left layering  pleural effusionand continued mild to moderate inflammation of the head of the pancreas with secondary involvement of duodenum.  -Blood cultures x 1 from 11/17 with Staph Aureus, sensitivities pending  -IV vancomycin (Day  3), start IV cefazolin (Day 1) per pharmacy  - NO central line or PICC line placed for the next 72 hours - 11/22 -TEE tomm due to concern for IE--  2012 records WITHOUT varices, if negative treat bacteremia for 4 weeks -Thoracentesis on 11/19--> 1000 ml of cloudy yellow gold pleural fluid was obtained. A sample was sent to Pathology for cytogenetics, flow, and cell counts, as well as for infection analysis -Repeat blood cultures x 2 - Morphine PRN pain -Oxygen therapy to keep SpO2 > 90%  Renal Insufficiency -  Cr increased to 1.76 Pt presented with Cr of 1.5 with unknown baseline. Possible etiology is pre-renal azotemia (hypovolemia) vs ATN (sepsis). Post-renal causes less likely considering no hydronpehrosis on CT abdomen Pt without HTN, edema, or proteinuria on UA to suggest nephrotic syndrome or glomerulonephritis.  -Avoid nephrotoxins (ACEi, ARB, NSAIDS, contrast)  -Continue to monitor BMP in setting of diuresis  -UA 11/19 --> WNL   Ileus -  Improving. Etiology unknown, most likely due to pancreatitis.  -Consider NG tube if nausea/vomiting/distension - no evidence of varies from records obtained- EGD in 2012  -Per GI consult -->  KUB today, advance to clear liquid -Limit narcotics   Compensated Liver Cirrhosis - Most likely secondary to past alcohol abuse and untreated viral hepatitis C infection. There was evidence of splenomegaly with varies on CT abdomen and distension on exam without other signs of stigmata of chronic liver disease. Pt's albumin is  2.5, bilirubin 2.4, INR 1.5, ascites that is medically controlled, and encephalopathy medically controlled with Child-Pugh Class C with life expectancy 1-3 years. CT abdomen with findings of cirrhosis with splenomegaly. Pt without ascites, fever, and encephalopathy, however with significant abdominal pain and distension still concern for SBP. Pt with last reported alcoholic drink 1 month ago. Pt reports compliance with diuretic therapy at home. On exam there was distension however hard to appreciate ascites, thus paracentesis was not performed.  -Continue IV lasix 40 mg daily  - possible transition to PO tomm -Obtain ethanol levels --> neg -Records obtained from [facility] ---> EGD from 10/2011 without esophageal varices -Obtain indirect (1.2) and direct bilirubin levels (1.9)  -Obtain prealbumin -Resume home lactulose 20 g TID   Hyponatremia - improving.  Baseline unknown. Most likely chronic due to liver cirrhosis. Hypotonic (276 serum osm) hypervolemic hyponatremia due to fluid overload. If euvolemic possibly due to SIADH from PNA.  -IV lasix 40 mg daily    -Monitor BMP   Thrombocytopenia - stable. Pt with splenomegaly and platelet count on admission of 126K (clumping present). Baseline platelet count unknown. No reports of recent bleeding.  -Continue to monitor CBC   Chronic Hepatitis C infection - stable.  Pt reports he was never treated. Complicated by liver cirrhosis. On admission pt with transaminitis (elevated AST 42). No evidence of rash,  glomerulonephritis, or neuropathy to suggest cryoglobulinemia. Pt was HIV negative and UDS was negative.  -Limit tylenol use -Obtain acute hepatitis panel --> HepS Ab neg, Hep A (NR), Hep C (reactive)   Diet: clear liquid DVT PPx: SCD  Code: Full if >50% chance of survival      Dispo: Disposition is deferred at this time, awaiting improvement of current medical problems.  Anticipated discharge in approximately 5 day(s).   The patient does  not have a current PCP (No Pcp Per Patient) and does need an [facility] hospital follow-up appointment after discharge.  The patient does not have transportation limitations that hinder transportation to clinic appointments.  .Services Needed at time of discharge: Y = Yes, Blank = No PT:   OT:   RN:   Equipment:   Other:     LOS: 2 days   Otis Brace, MD 10/26/2013, 7:20 AM

## 2013-10-27 DIAGNOSIS — I8390 Asymptomatic varicose veins of unspecified lower extremity: Secondary | ICD-10-CM

## 2013-10-27 DIAGNOSIS — A4902 Methicillin resistant Staphylococcus aureus infection, unspecified site: Secondary | ICD-10-CM

## 2013-10-27 DIAGNOSIS — J15212 Pneumonia due to Methicillin resistant Staphylococcus aureus: Secondary | ICD-10-CM

## 2013-10-27 DIAGNOSIS — J869 Pyothorax without fistula: Secondary | ICD-10-CM

## 2013-10-27 LAB — BASIC METABOLIC PANEL
BUN: 41 mg/dL — ABNORMAL HIGH (ref 6–23)
CO2: 26 mEq/L (ref 19–32)
Calcium: 7.8 mg/dL — ABNORMAL LOW (ref 8.4–10.5)
Calcium: 7.5 mg/dL — ABNORMAL LOW (ref 8.4–10.5)
Chloride: 95 mEq/L — ABNORMAL LOW (ref 96–112)
Creatinine, Ser: 1.32 mg/dL (ref 0.50–1.35)
Creatinine, Ser: 1.25 mg/dL (ref 0.50–1.35)
GFR calc Af Amer: 64 mL/min — ABNORMAL LOW (ref >90)
GFR calc Af Amer: 69 mL/min — ABNORMAL LOW (ref >90)
GFR calc non Af Amer: 55 mL/min — ABNORMAL LOW (ref >90)
GFR calc non Af Amer: 59 mL/min — ABNORMAL LOW (ref >90)
Sodium: 129 mEq/L — ABNORMAL LOW (ref 135–145)

## 2013-10-27 LAB — CBC WITH DIFFERENTIAL/PLATELET
Basophils Absolute: 0 10*3/uL (ref 0.0–0.1)
Basophils Relative: 0 % (ref 0–1)
Eosinophils Absolute: 0.2 10*3/uL (ref 0.0–0.7)
HCT: 36.5 % — ABNORMAL LOW (ref 39.0–52.0)
Hemoglobin: 13.1 g/dL (ref 13.0–17.0)
MCH: 36.1 pg — ABNORMAL HIGH (ref 26.0–34.0)
MCHC: 35.9 g/dL (ref 30.0–36.0)
Monocytes Absolute: 1.6 10*3/uL — ABNORMAL HIGH (ref 0.1–1.0)
Monocytes Relative: 8 % (ref 3–12)
Neutro Abs: 16.1 10*3/uL — ABNORMAL HIGH (ref 1.7–7.7)
Neutrophils Relative %: 84 % — ABNORMAL HIGH (ref 43–77)
Platelets: 146 10*3/uL — ABNORMAL LOW (ref 150–400)

## 2013-10-27 LAB — PH, BODY FLUID: pH, Fluid: 8.5

## 2013-10-27 LAB — CHOLESTEROL, TOTAL
Cholesterol: 52 mg/dL (ref 0–200)
Cholesterol: 49 mg/dL (ref 0–200)

## 2013-10-27 LAB — CULTURE, BLOOD (ROUTINE X 2)

## 2013-10-27 LAB — PREALBUMIN: Prealbumin: 1.4 mg/dL — ABNORMAL LOW (ref 17.0–34.0)

## 2013-10-27 LAB — PROCALCITONIN
Procalcitonin: 0.96 ng/mL
Procalcitonin: 0.82 ng/mL

## 2013-10-27 LAB — MAGNESIUM: Magnesium: 2.4 mg/dL (ref 1.5–2.5)

## 2013-10-27 LAB — CLOSTRIDIUM DIFFICILE BY PCR: Toxigenic C. Difficile by PCR: NEGATIVE

## 2013-10-27 LAB — VANCOMYCIN, TROUGH: Vancomycin Tr: 16 ug/mL (ref 10.0–20.0)

## 2013-10-27 MED ORDER — SPIRONOLACTONE 100 MG PO TABS
100.0000 mg | ORAL_TABLET | Freq: Every day | ORAL | Status: DC
  Administered 2013-10-27 – 2013-11-01 (×5): 100 mg via ORAL
  Filled 2013-10-27 (×7): qty 1

## 2013-10-27 MED ORDER — HEPATITIS A VACCINE 1440 EL U/ML IM SUSP
1.0000 mL | Freq: Once | INTRAMUSCULAR | Status: DC
  Filled 2013-10-27 (×2): qty 1

## 2013-10-27 MED ORDER — FUROSEMIDE 40 MG PO TABS
40.0000 mg | ORAL_TABLET | Freq: Every day | ORAL | Status: DC
  Administered 2013-10-27 – 2013-11-01 (×5): 40 mg via ORAL
  Filled 2013-10-27 (×8): qty 1

## 2013-10-27 MED ORDER — SODIUM CHLORIDE 0.9 % IV SOLN
INTRAVENOUS | Status: DC
  Administered 2013-10-27: 20 mL/h via INTRAVENOUS

## 2013-10-27 NOTE — Progress Notes (Signed)
ANTIBIOTIC CONSULT NOTE - FOLLOW UP  Pharmacy Consult for vancomycin Indication: bacteremia  Allergies  Allergen Reactions  . Codeine Hives    Patient Measurements: Height: 5\' 8"  (172.7 cm) Weight: 209 lb 3.5 oz (94.9 kg) IBW/kg (Calculated) : 68.4   Vital Signs: Temp: 97.6 F (36.4 C) (11/20 0739) Temp src: Oral (11/20 0739) BP: 112/81 mmHg (11/20 0739) Pulse Rate: 84 (11/20 0739) Intake/Output from previous day: 11/19 0701 - 11/20 0700 In: 406 [P.O.:120; I.V.:286] Out: 1050 [Urine:1050] Intake/Output from this shift:    Labs:  Recent Labs  10/25/13 1500 10/25/13 2315 10/26/13 0448 10/27/13 0625 10/27/13 0854  WBC 37.0*  --  25.0* 19.1*  --   HGB 13.7  --  13.8 13.1  --   PLT 178  --  153 146*  --   CREATININE 1.67* 1.71* 1.76*  --  1.32   Estimated Creatinine Clearance: 63.2 ml/min (by C-G formula based on Cr of 1.32).  Recent Labs  10/27/13 0625  VANCOTROUGH 16.0     Microbiology: Recent Results (from the past 720 hour(s))  CULTURE, BLOOD (ROUTINE X 2)     Status: None   Collection Time    10/24/13  3:57 PM      Result Value Range Status   Specimen Description BLOOD LEFT ANTECUBITAL   Final   Special Requests BOTTLES DRAWN AEROBIC AND ANAEROBIC 10CC   Final   Culture  Setup Time     Final   Value: 10/24/2013 22:44     Performed at [facility]   Culture     Final   Value: METHICILLIN RESISTANT STAPHYLOCOCCUS AUREUS     Note: RIFAMPIN AND GENTAMICIN SHOULD NOT BE USED AS SINGLE DRUGS FOR TREATMENT OF STAPH INFECTIONS. This organism DOES NOT demonstrate inducible Clindamycin resistance in vitro. CRITICAL RESULT CALLED TO, READ BACK BY AND VERIFIED WITH: Gae Gallop RN      10/27/13 830AM BY INGRAM A      Note: Gram Stain Report Called to,Read Back By and Verified With: Holland Commons ON 10/25/2013 AT 8:17P BY WILEJ     Performed at [facility]   Report Status 10/27/2013 FINAL   Final   Organism ID, Bacteria METHICILLIN RESISTANT  STAPHYLOCOCCUS AUREUS   Final  CULTURE, BLOOD (ROUTINE X 2)     Status: None   Collection Time    10/24/13  4:07 PM      Result Value Range Status   Specimen Description BLOOD HAND LEFT   Final   Special Requests BOTTLES DRAWN AEROBIC ONLY [facility]   Final   Culture  Setup Time     Final   Value: 10/24/2013 22:43     Performed at [facility]   Culture     Final   Value: STAPHYLOCOCCUS AUREUS     Note: RIFAMPIN AND GENTAMICIN SHOULD NOT BE USED AS SINGLE DRUGS FOR TREATMENT OF STAPH INFECTIONS.     Note: Gram Stain Report Called to,Read Back By and Verified With: SAMANTHA CLAXTON 10/26/13 0845 BY SMITHERSJ     Performed at [facility]   Report Status PENDING   Incomplete  MRSA PCR SCREENING     Status: Abnormal   Collection Time    10/24/13  7:55 PM      Result Value Range Status   MRSA by PCR POSITIVE (*) NEGATIVE Final   Comment:            The GeneXpert MRSA Assay (FDA  approved for NASAL specimens     only), is one component of a     comprehensive MRSA colonization     surveillance program. It is not     intended to diagnose MRSA     infection nor to guide or     monitor treatment for     MRSA infections.     RESULT CALLED TO, READ BACK BY AND VERIFIED WITH:     YORK,M RN 2358 10/24/13 MITCHELL,L  CULTURE, BLOOD (ROUTINE X 2)     Status: None   Collection Time    10/26/13  1:35 PM      Result Value Range Status   Specimen Description BLOOD LEFT ANTECUBITAL   Final   Special Requests BOTTLES DRAWN AEROBIC AND ANAEROBIC 10CC   Final   Culture  Setup Time     Final   Value: 10/26/2013 20:14     Performed at [facility]   Culture     Final   Value:        BLOOD CULTURE RECEIVED NO GROWTH TO DATE CULTURE WILL BE HELD FOR 5 DAYS BEFORE ISSUING A FINAL NEGATIVE REPORT     Performed at [facility]   Report Status PENDING   Incomplete  CULTURE, BLOOD (ROUTINE X 2)     Status: None   Collection Time    10/26/13  1:45 PM      Result Value  Range Status   Specimen Description BLOOD LEFT HAND   Final   Special Requests     Final   Value: BOTTLES DRAWN AEROBIC AND ANAEROBIC BLUE 10CC RED 7CC   Culture  Setup Time     Final   Value: 10/26/2013 20:14     Performed at [facility]   Culture     Final   Value:        BLOOD CULTURE RECEIVED NO GROWTH TO DATE CULTURE WILL BE HELD FOR 5 DAYS BEFORE ISSUING A FINAL NEGATIVE REPORT     Performed at [facility]   Report Status PENDING   Incomplete  CLOSTRIDIUM DIFFICILE BY PCR     Status: None   Collection Time    10/26/13  2:21 PM      Result Value Range Status   C difficile by pcr NEGATIVE  NEGATIVE Final  BODY FLUID CULTURE     Status: None   Collection Time    10/26/13  2:21 PM      Result Value Range Status   Specimen Description PLEURAL FLUID LEFT   Final   Special Requests 6.0ML FLUID   Final   Gram Stain     Final   Value: WBC PRESENT, PREDOMINANTLY PMN     NO ORGANISMS SEEN     Performed at [facility]   Culture     Final   Value: NO GROWTH 1 DAY     Performed at [facility]   Report Status PENDING   Incomplete    Anti-infectives   Start     Dose/Rate Route Frequency Ordered Stop   10/26/13 1300  ceFAZolin (ANCEF) IVPB 2 g/50 mL premix  Status:  Discontinued     2 g 100 mL/hr over 30 Minutes Intravenous 3 times per day 10/26/13 1206 10/27/13 0955   10/25/13 1230  metroNIDAZOLE (FLAGYL) IVPB 500 mg  Status:  Discontinued     500 mg 100 mL/hr over 60 Minutes Intravenous Every 8 hours 10/25/13 1123 10/26/13 0914  10/25/13 0600  vancomycin (VANCOCIN) IVPB 750 mg/150 ml premix     750 mg 150 mL/hr over 60 Minutes Intravenous Every 12 hours 10/24/13 1611     10/24/13 2200  piperacillin-tazobactam (ZOSYN) IVPB 3.375 g  Status:  Discontinued     3.375 g 12.5 mL/hr over 240 Minutes Intravenous 3 times per day 10/24/13 1611 10/26/13 1148   10/24/13 1600  vancomycin (VANCOCIN) IVPB 1000 mg/200 mL premix     1,000 mg 200 mL/hr over 60  Minutes Intravenous  Once 10/24/13 1548 10/24/13 1757   10/24/13 1600  piperacillin-tazobactam (ZOSYN) IVPB 4.5 g  Status:  Discontinued     4.5 g 200 mL/hr over 30 Minutes Intravenous  Once 10/24/13 1548 10/24/13 1555   10/24/13 1600  piperacillin-tazobactam (ZOSYN) IVPB 3.375 g     3.375 g 100 mL/hr over 30 Minutes Intravenous  Once 10/24/13 1556 10/24/13 1706      Assessment: 62 YOM with hx of cirrhosis and varices who 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 Vancomycin was stared 11/18 and cefazolin was added yesterday. Now cefazolin d/c'd as s.aureus is resistant. Vancomycin trough drawn this morning was within therapeutic range at 48mcg/mL on 750mg  IV q12h. SCr has improved slightly today. Will hopefully be able to get TEE with history of varices.  Goal of Therapy:  Vancomycin trough level 15-20 mcg/ml  Plan:  1. Continue vancomycin 750mg  IV q12h 2. Follow TEE, repeat blood cultures, pleural effusion cultures, clinical progression, renal function, and LOT 3. Repeat trough if renal function drastically changes from today.  Legna Mausolf D. Quinisha Mould, PharmD, BCPS Clinical Pharmacist Pager: [phone] 10/27/2013 10:01 AM

## 2013-10-27 NOTE — Progress Notes (Addendum)
INFECTIOUS DISEASE PROGRESS NOTE  ID: Mariano Doshi is a 62 y.o. male with  Principal Problem:   Sepsis Active Problems:   Hep C w/o coma, chronic   Liver cirrhosis   Acute respiratory failure with hypoxia   Ileus, acute   Pancreatitis, acute   Large Pleural effusion on left   AKI (acute kidney injury)   Chronic kidney disease (CKD), stage IV (severe)   Hyponatremia   Hypoalbuminemia   Hyperbilirubinemia   Essential hypertension, benign   Thrombocytopenia, chronic  Subjective: Up in chair, without complaints  Abtx:  Anti-infectives   Start     Dose/Rate Route Frequency Ordered Stop   10/26/13 1300  ceFAZolin (ANCEF) IVPB 2 g/50 mL premix     2 g 100 mL/hr over 30 Minutes Intravenous 3 times per day 10/26/13 1206     10/25/13 1230  metroNIDAZOLE (FLAGYL) IVPB 500 mg  Status:  Discontinued     500 mg 100 mL/hr over 60 Minutes Intravenous Every 8 hours 10/25/13 1123 10/26/13 0914   10/25/13 0600  vancomycin (VANCOCIN) IVPB 750 mg/150 ml premix     750 mg 150 mL/hr over 60 Minutes Intravenous Every 12 hours 10/24/13 1611     10/24/13 2200  piperacillin-tazobactam (ZOSYN) IVPB 3.375 g  Status:  Discontinued     3.375 g 12.5 mL/hr over 240 Minutes Intravenous 3 times per day 10/24/13 1611 10/26/13 1148   10/24/13 1600  vancomycin (VANCOCIN) IVPB 1000 mg/200 mL premix     1,000 mg 200 mL/hr over 60 Minutes Intravenous  Once 10/24/13 1548 10/24/13 1757   10/24/13 1600  piperacillin-tazobactam (ZOSYN) IVPB 4.5 g  Status:  Discontinued     4.5 g 200 mL/hr over 30 Minutes Intravenous  Once 10/24/13 1548 10/24/13 1555   10/24/13 1600  piperacillin-tazobactam (ZOSYN) IVPB 3.375 g     3.375 g 100 mL/hr over 30 Minutes Intravenous  Once 10/24/13 1556 10/24/13 1706      Medications:  Scheduled: . antiseptic oral rinse  15 mL Mouth Rinse q12n4p  .  ceFAZolin (ANCEF) IV  2 g Intravenous Q8H  . chlorhexidine  15 mL Mouth Rinse BID  . Chlorhexidine Gluconate Cloth  6 each  Topical Q0600  . furosemide  40 mg Intravenous Daily  . lactulose  20 g Oral TID  . mupirocin ointment  1 application Nasal BID  . sodium chloride  3 mL Intravenous Q12H  . vancomycin  750 mg Intravenous Q12H    Objective: Vital signs in last 24 hours: Temp:  [97.5 F (36.4 C)-98 F (36.7 C)] 97.6 F (36.4 C) (11/20 0739) Pulse Rate:  [69-102] 84 (11/20 0739) Resp:  [17-23] 17 (11/20 0739) BP: (99-142)/(63-93) 112/81 mmHg (11/20 0739) SpO2:  [91 %-98 %] 97 % (11/20 0739) Weight:  [94.9 kg (209 lb 3.5 oz)] 94.9 kg (209 lb 3.5 oz) (11/20 0442)   General appearance: alert, cooperative and no distress Resp: clear anteriorly Cardio: regular rate and rhythm GI: normal findings: bowel sounds normal and soft, non-tender Extremities: edema none  Lab Results  Recent Labs  10/25/13 2315 10/26/13 0448 10/27/13 0625  WBC  --  25.0* 19.1*  HGB  --  13.8 13.1  HCT  --  37.6* 36.5*  NA 130* 131*  --   K 4.3 4.5  --   CL 98 97  --   CO2 26 28  --   BUN 42* 44*  --   CREATININE 1.71* 1.76*  --    Liver  Panel  Recent Labs  10/25/13 0614 10/26/13 0448 10/26/13 1615  PROT 6.5 6.3 6.2  ALBUMIN 2.2* 1.9*  --   AST 30 31  --   ALT 22 19  --   ALKPHOS 103 114  --   BILITOT 3.1* 3.1*  --   BILIDIR  --  1.9*  --   IBILI  --  1.2*  --    Sedimentation Rate No results found for this basename: ESRSEDRATE,  in the last 72 hours C-Reactive Protein No results found for this basename: CRP,  in the last 72 hours  Microbiology: Recent Results (from the past 240 hour(s))  CULTURE, BLOOD (ROUTINE X 2)     Status: None   Collection Time    10/24/13  3:57 PM      Result Value Range Status   Specimen Description BLOOD LEFT ANTECUBITAL   Final   Special Requests BOTTLES DRAWN AEROBIC AND ANAEROBIC 10CC   Final   Culture  Setup Time     Final   Value: 10/24/2013 22:44     Performed at [facility]   Culture     Final   Value: METHICILLIN RESISTANT STAPHYLOCOCCUS AUREUS      Note: RIFAMPIN AND GENTAMICIN SHOULD NOT BE USED AS SINGLE DRUGS FOR TREATMENT OF STAPH INFECTIONS. This organism DOES NOT demonstrate inducible Clindamycin resistance in vitro. CRITICAL RESULT CALLED TO, READ BACK BY AND VERIFIED WITH: Gae Gallop RN      10/27/13 830AM BY INGRAM A      Note: Gram Stain Report Called to,Read Back By and Verified With: Holland Commons ON 10/25/2013 AT 8:17P BY WILEJ     Performed at [facility]   Report Status 10/27/2013 FINAL   Final   Organism ID, Bacteria METHICILLIN RESISTANT STAPHYLOCOCCUS AUREUS   Final  CULTURE, BLOOD (ROUTINE X 2)     Status: None   Collection Time    10/24/13  4:07 PM      Result Value Range Status   Specimen Description BLOOD HAND LEFT   Final   Special Requests BOTTLES DRAWN AEROBIC ONLY [facility]   Final   Culture  Setup Time     Final   Value: 10/24/2013 22:43     Performed at [facility]   Culture     Final   Value: STAPHYLOCOCCUS AUREUS     Note: RIFAMPIN AND GENTAMICIN SHOULD NOT BE USED AS SINGLE DRUGS FOR TREATMENT OF STAPH INFECTIONS.     Note: Gram Stain Report Called to,Read Back By and Verified With: SAMANTHA CLAXTON 10/26/13 0845 BY SMITHERSJ     Performed at [facility]   Report Status PENDING   Incomplete  MRSA PCR SCREENING     Status: Abnormal   Collection Time    10/24/13  7:55 PM      Result Value Range Status   MRSA by PCR POSITIVE (*) NEGATIVE Final   Comment:            The GeneXpert MRSA Assay (FDA     approved for NASAL specimens     only), is one component of a     comprehensive MRSA colonization     surveillance program. It is not     intended to diagnose MRSA     infection nor to guide or     monitor treatment for     MRSA infections.     RESULT CALLED TO, READ BACK BY AND VERIFIED WITH:  YORK,M RN 2358 10/24/13 MITCHELL,L  CULTURE, BLOOD (ROUTINE X 2)     Status: None   Collection Time    10/26/13  1:35 PM      Result Value Range Status   Specimen Description BLOOD  LEFT ANTECUBITAL   Final   Special Requests BOTTLES DRAWN AEROBIC AND ANAEROBIC 10CC   Final   Culture  Setup Time     Final   Value: 10/26/2013 20:14     Performed at [facility]   Culture     Final   Value:        BLOOD CULTURE RECEIVED NO GROWTH TO DATE CULTURE WILL BE HELD FOR 5 DAYS BEFORE ISSUING A FINAL NEGATIVE REPORT     Performed at [facility]   Report Status PENDING   Incomplete  CULTURE, BLOOD (ROUTINE X 2)     Status: None   Collection Time    10/26/13  1:45 PM      Result Value Range Status   Specimen Description BLOOD LEFT HAND   Final   Special Requests     Final   Value: BOTTLES DRAWN AEROBIC AND ANAEROBIC BLUE 10CC RED 7CC   Culture  Setup Time     Final   Value: 10/26/2013 20:14     Performed at [facility]   Culture     Final   Value:        BLOOD CULTURE RECEIVED NO GROWTH TO DATE CULTURE WILL BE HELD FOR 5 DAYS BEFORE ISSUING A FINAL NEGATIVE REPORT     Performed at [facility]   Report Status PENDING   Incomplete  BODY FLUID CULTURE     Status: None   Collection Time    10/26/13  2:21 PM      Result Value Range Status   Specimen Description PLEURAL FLUID LEFT   Final   Special Requests 6.0ML FLUID   Final   Gram Stain     Final   Value: WBC PRESENT, PREDOMINANTLY PMN     NO ORGANISMS SEEN     Performed at [facility]   Culture     Final   Value: NO GROWTH 1 DAY     Performed at [facility]   Report Status PENDING   Incomplete    Studies/Results: Ct Abdomen Pelvis Wo Contrast  10/25/2013   CLINICAL DATA:  62 year old male with worsening abdominal pain and distention. Earlier abdominal radiographs for which pneumoperitoneum was difficult to exclude. Recently diagnosed cirrhosis and possible mild pancreatitis. Initial encounter.  EXAM: CT ABDOMEN AND PELVIS WITHOUT CONTRAST  TECHNIQUE: Multidetector CT imaging of the abdomen and pelvis was performed following the standard protocol without intravenous  contrast.  COMPARISON:  Abdominal radiographs from 0811 hr the same day. CT Abdomen and Pelvis 10/24/2013.  FINDINGS: No additional contrast administered for this scan.  Interval increased left pleural effusion, moderate to large and with complete left lower lobe atelectasis. No pericardial effusion. No right pleural effusion. Continued confluent right lower lobe basal segment atelectasis versus consolidation with air bronchograms. This finding might have stimulated pneumoperitoneum on the earlier radiographs.  Gynecomastia.  Stable visualized osseous structures. Disc and endplate degeneration in the lumbar spine.  No pneumoperitoneum.  Trace fluid in an otherwise fact containing left inguinal hernia is stable. No free fluid in the pelvis. Unremarkable bladder. Negative distal colon. Sigmoid diverticulosis but no active inflammation. Oral contrast has reached the proximal sigmoid colon. Negative left colon. Negative transverse  colon. Negative right colon, cecum and appendix.  Featureless air and fluid filled small bowel loops with increased small bowel caliber since 10/24/2013, now up to 41 mm diameter. Still, no small bowel wall thickening. No abrupt small bowel transition point.  Nodular cirrhotic liver appears stable. Cholelithiasis. No pericholecystic inflammation. Stable spleen with suggestion of splint around all varices. Stable non contrast kidneys. No hydronephrosis or hydroureter.  The stomach now is decompressed. There is persistent inflammation in the lesser sac primarily about the head of the pancreas and duodenum (series 2, image 41). No associated fluid collection. No abdominal free fluid.  Umbilical hernia containing fat an a small volume of fluid is stable.  IMPRESSION: 1. Negative for pneumoperitoneum.  2. Appearance of bowel most compatible with ileus. Interval dilation of small bowel up to 41 mm diameter, but contrast and gas throughout the colon and no small bowel transition point.  3. Continued  mild to moderate inflammation about the head of the pancreas with secondary involvement of the duodenum. No fluid collection or free fluid.  4. Large layering left pleural effusion has increased. Complete left lower lobe compressive atelectasis. Continued confluent right lung base atelectasis versus consolidation.  5. Cirrhosis. Cholelithiasis. Sigmoid diverticulosis. Small umbilical and left inguinal hernias containing fat and trace fluid.   Electronically Signed   By: Augusto Gamble M.D.   On: 10/25/2013 11:33   Dg Abd 1 View  10/27/2013   CLINICAL DATA:  Ileus versus small bowel obstruction.  EXAM: ABDOMEN - 1 VIEW  COMPARISON:  10/25/2013 CT scan  FINDINGS: No significant change in the colonic contrast medium. Sigmoid diverticulosis.  Small bowel loops are dilated up to 4.3 cm.  IMPRESSION: 1. Continued small bowel dilatation up to 4.3 cm, favoring ileus. Contrast medium stasis in the colon. 2. Sigmoid diverticulosis.   Electronically Signed   By: [name] M.D.   On: 10/27/2013 00:05   Dg Chest Port 1 View  10/26/2013   CLINICAL DATA:  Status post left thoracentesis.  EXAM: PORTABLE CHEST - 1 VIEW  COMPARISON:  10/1913 chest radiograph CT 0938 hr  FINDINGS: There is a small left pleural effusion, significantly decreased in size compared to the prior study. No pneumothorax is identified. The lungs are hypoinflated, more so than on the prior study. There is slightly improved aeration at the left lung base. Persistent opacity in the left lung base and streaky opacity in the right lung base may represent atelectasis. There is persistent more confluent opacity in the medial right lung base.  IMPRESSION: 1. Greatly decreased size of left pleural effusion following thoracentesis. No pneumothorax identified. 2. Hypoinflated lungs with bibasilar opacities, suggestive of atelectasis although infection is not excluded, particularly in the medial right lung base.   Electronically Signed   By: Sebastian Ache   On:  10/26/2013 14:49   Dg Chest Port 1 View  10/26/2013   CLINICAL DATA:  Pneumonia  EXAM: PORTABLE CHEST - 1 VIEW  COMPARISON:  10/25/2013  FINDINGS: Moderately large left pleural effusion unchanged. Left lower lobe consolidation unchanged.  Progression of right lower lobe airspace disease which may be pneumonia or atelectasis. No significant effusion on the right. Negative for heart failure.  IMPRESSION: Moderate left effusion and left lower lobe consolidation unchanged. This suspicious for pneumonia and possibly empyema. Chest CT with contrast suggested for further evaluation.  Progression of right lower lobe atelectasis/ infiltrate.   Electronically Signed   By: Marlan Palau M.D.   On: 10/26/2013 10:24  Assessment/Plan: Hep C Cirrhosis Varicies Empyema MRSA bacteremia  Total days of antibiotics: 4 vanco/ancef  Will stop ancef Await TEE Will defer to CV about when/if he can have TEE with hx of varicies.  Repeat BCx 11-19 ngtd Empyema Cx pending.  Start Hep A vaccine         Johny Sax Infectious Diseases (pager) [phone] [email or web address] 10/27/2013, 9:39 AM  LOS: 3 days

## 2013-10-27 NOTE — Progress Notes (Signed)
PULMONARY  / CRITICAL CARE MEDICINE  Name: [name] [name] MRN: [id number] DOB: 1951/12/04    ADMISSION DATE:  10/24/2013 CONSULTATION DATE:  10/25/13  REFERRING MD :  Dr. Criselda Peaches PRIMARY SERVICE:  Internal medicine teaching service  CHIEF COMPLAINT:  Abdominal pain, distention, and shortness of breath   BRIEF PATIENT DESCRIPTION: 62 y.o. With HTN, untreated chronic hep C, cirrhosis and umbilical hernia was admitted 11/17 for hypovolemia and left sided abdominal pain secondary to a UTI diagnosed at [facility] hospital in [place].  PCCM called to evaluate pleural effusion and ascites.  SIGNIFICANT EVENTS / STUDIES:  11/12 diagnosed with UTI at [facility] in [place] given Cipro 11/17 admitted 11/18 CT abd and pelvis Cirrhosis, large left pleural effusion, slightly dilated bowel loops no obstruction likely an ileus.  Cholelithiasis, and inflammation of pancreatic head. 11/19 Blood cultures show Staph, ID consulted 11/19 Thoracentesis, marigold cloudy fluid, pH 8.5, LD 906, exudative, no current growth on cultures, large WBCs   LINES / TUBES: PIV  CULTURES: 11/17 Blood x2 >> Gram + cocci in pairs, Staph 11/19 Pleural fluid cx >> WBCs no organisms  11/19 Pleural fungal cx >> NGTD  ANTIBIOTICS: 11/18 Vanc >> 11/18 Zosyn >> 11/19 11/19 Ancef >> 11/19   PAST MEDICAL HISTORY :  Past Medical History  Diagnosis Date  . Cirrhosis   . Hepatitis C   . Inguinal hernia     "for a really long time"  . Hypertension   . Umbilical hernia    History reviewed. No pertinent past surgical history. Prior to Admission medications   Medication Sig Start Date End Date Taking? Authorizing Provider  ciprofloxacin (CIPRO) 500 MG tablet Take 500 mg by mouth 2 (two) times daily.   Yes Historical Provider, MD  furosemide (LASIX) 40 MG tablet Take 40 mg by mouth daily.   Yes Historical Provider, MD  ibuprofen (ADVIL,MOTRIN) 200 MG tablet Take 1,000 mg by mouth daily as needed for mild pain or moderate pain.   Yes  Historical Provider, MD  lactulose (CHRONULAC) 10 GM/15ML solution Take 20 g by mouth 3 (three) times daily.   Yes Historical Provider, MD  potassium chloride SA (K-DUR,KLOR-CON) 20 MEQ tablet Take 20 mEq by mouth daily.   Yes Historical Provider, MD  spironolactone (ALDACTONE) 100 MG tablet Take 100 mg by mouth daily.   Yes Historical Provider, MD   Allergies  Allergen Reactions  . Codeine Hives    FAMILY HISTORY:  No family history on file. SOCIAL HISTORY:  reports that he has quit smoking. He does not have any smokeless tobacco history on file. He reports that he does not drink alcohol or use illicit drugs.  SUBJECTIVE: Tired and hungry but feels better.    VITAL SIGNS: Temp:  [97.5 F (36.4 C)-98.1 F (36.7 C)] 98.1 F (36.7 C) (11/20 1131) Pulse Rate:  [69-102] 79 (11/20 1131) Resp:  [17-27] 27 (11/20 1131) BP: (99-142)/(63-93) 118/84 mmHg (11/20 1131) SpO2:  [91 %-98 %] 98 % (11/20 1131) Weight:  [209 lb 3.5 oz (94.9 kg)] 209 lb 3.5 oz (94.9 kg) (11/20 0442)  PHYSICAL EXAMINATION: General:  Chronically ill appearing male, resting comfortably Neuro:  A&Ox4, appropriate conversation skills, moves extremitiesx4 HEENT:  Mild scleral icterus, MM moist and pink Neck:  No JVD Cardiovascular:  RRR, no M  Lungs:  Diminished breath sounds bibasilar, L>R, no appreciable R/R/W Abdomen:  +BS, mildly protuberant, mildly tender palpation left upper quadrant, large umbilical hernia, reducible Musculoskeletal:  No gross deformities, mild BLE swelling Skin:  Mildly jaundiced, no obvious breakdowns  Recent Labs Lab 10/25/13 2315 10/26/13 0448 10/27/13 0854  NA 130* 131* 129*  K 4.3 4.5 4.2  CL 98 97 95*  CO2 26 28 26   BUN 42* 44* 41*  CREATININE 1.71* 1.76* 1.32  GLUCOSE 116* 111* 139*    Recent Labs Lab 10/25/13 1500 10/26/13 0448 10/27/13 0625  HGB 13.7 13.8 13.1  HCT 37.7* 37.6* 36.5*  WBC 37.0* 25.0* 19.1*  PLT 178 153 146*   Dg Abd 1 View  10/27/2013    CLINICAL DATA:  Ileus versus small bowel obstruction.  EXAM: ABDOMEN - 1 VIEW  COMPARISON:  10/25/2013 CT scan  FINDINGS: No significant change in the colonic contrast medium. Sigmoid diverticulosis.  Small bowel loops are dilated up to 4.3 cm.  IMPRESSION: 1. Continued small bowel dilatation up to 4.3 cm, favoring ileus. Contrast medium stasis in the colon. 2. Sigmoid diverticulosis.   Electronically Signed   By: [name] M.D.   On: 10/27/2013 00:05   Dg Chest Port 1 View  10/26/2013   CLINICAL DATA:  Status post left thoracentesis.  EXAM: PORTABLE CHEST - 1 VIEW  COMPARISON:  10/1913 chest radiograph CT 0938 hr  FINDINGS: There is a small left pleural effusion, significantly decreased in size compared to the prior study. No pneumothorax is identified. The lungs are hypoinflated, more so than on the prior study. There is slightly improved aeration at the left lung base. Persistent opacity in the left lung base and streaky opacity in the right lung base may represent atelectasis. There is persistent more confluent opacity in the medial right lung base.  IMPRESSION: 1. Greatly decreased size of left pleural effusion following thoracentesis. No pneumothorax identified. 2. Hypoinflated lungs with bibasilar opacities, suggestive of atelectasis although infection is not excluded, particularly in the medial right lung base.   Electronically Signed   By: Sebastian Ache   On: 10/26/2013 14:49   Dg Chest Port 1 View  10/26/2013   CLINICAL DATA:  Pneumonia  EXAM: PORTABLE CHEST - 1 VIEW  COMPARISON:  10/25/2013  FINDINGS: Moderately large left pleural effusion unchanged. Left lower lobe consolidation unchanged.  Progression of right lower lobe airspace disease which may be pneumonia or atelectasis. No significant effusion on the right. Negative for heart failure.  IMPRESSION: Moderate left effusion and left lower lobe consolidation unchanged. This suspicious for pneumonia and possibly empyema. Chest CT with  contrast suggested for further evaluation.  Progression of right lower lobe atelectasis/ infiltrate.   Electronically Signed   By: Marlan Palau M.D.   On: 10/26/2013 10:24    ASSESSMENT / PLAN: - Pleural effusion- exudative effusion, no growth currently on cx.  - Effusion does not meet criteria for empyema.  - Differential for effusion includes parapneumonic effusion, inflammatory causes, and malignancy.   - if parapneumonic effusion patient is currently being treated with Vanc.   - Inflammatory causes not likely given no history of arthralgias.  Can send Rh factor, ESR, ANA, and C reactive protein.     - Awaiting cytology report from path  - MRSA Bactermia  - ID following  - abx per ID  Toni Amend [facility] Physician Assistant Student 10/27/13, 11:58 AM  Today's summary: Pleural fluid shows exudative effusion.  It is NOT and empyema.  There is currently no growth on pleural fluid cx.  It is likely this is a parapneumonic effusion, the only questionable part is source, if due to pneumonia the absence of clear infiltrate  on the left makes it less likely, if due to SBP classically its on the right but could potentially have diaphragmatic defects on the left.  Unlikely to be an inflammatory etiology given the WBC differential (85% PMNs with minimal lymphocytes and no eosinophils).  Current abx should provide acceptable coverage.  Can send for autoimmune causes.  Awaiting cytology report from path.  No chest tube.  Patient seen and examined, agree with above note.  I dictated the care and orders written for this patient under my direction.  Alyson Reedy, MD [phone]

## 2013-10-27 NOTE — Progress Notes (Signed)
[facility] Progress Note    Since last GI note: Thoracentesis yesterday shows bacteria +, I think this empyema explains his current clinical situation (WBC, left sided pains, bacteremia).    Objective: Vital signs in last 24 hours: Temp:  [97.5 F (36.4 C)-98 F (36.7 C)] 97.6 F (36.4 C) (11/20 0739) Pulse Rate:  [69-102] 84 (11/20 0739) Resp:  [17-23] 17 (11/20 0739) BP: (99-142)/(63-93) 112/81 mmHg (11/20 0739) SpO2:  [91 %-98 %] 97 % (11/20 0739) Weight:  [209 lb 3.5 oz (94.9 kg)] 209 lb 3.5 oz (94.9 kg) (11/20 0442)   General: alert and oriented times 3 Heart: regular rate and rythm Abdomen: soft, non-tender, non-distended, normal bowel sounds   Lab Results:  Recent Labs  10/25/13 1500 10/26/13 0448 10/27/13 0625  WBC 37.0* 25.0* 19.1*  HGB 13.7 13.8 13.1  PLT 178 153 146*  MCV 100.0 103.6* 100.6*    Recent Labs  10/25/13 1500 10/25/13 2315 10/26/13 0448  NA 129* 130* 131*  K 5.6* 4.3 4.5  CL 96 98 97  CO2 21 26 28   GLUCOSE 105* 116* 111*  BUN 39* 42* 44*  CREATININE 1.67* 1.71* 1.76*  CALCIUM 8.3* 7.9* 7.9*    Recent Labs  10/25/13 0614 10/26/13 0448 10/26/13 1615  PROT 6.5 6.3 6.2  ALBUMIN 2.2* 1.9*  --   AST 30 31  --   ALT 22 19  --   ALKPHOS 103 114  --   BILITOT 3.1* 3.1*  --   BILIDIR  --  1.9*  --   IBILI  --  1.2*  --     Recent Labs  10/26/13 0448  INR 1.50*    Medications: Scheduled Meds: . antiseptic oral rinse  15 mL Mouth Rinse q12n4p  .  ceFAZolin (ANCEF) IV  2 g Intravenous Q8H  . chlorhexidine  15 mL Mouth Rinse BID  . Chlorhexidine Gluconate Cloth  6 each Topical Q0600  . furosemide  40 mg Intravenous Daily  . lactulose  20 g Oral TID  . mupirocin ointment  1 application Nasal BID  . sodium chloride  3 mL Intravenous Q12H  . vancomycin  750 mg Intravenous Q12H   Continuous Infusions: . sodium chloride    . sodium chloride 20 mL/hr (10/27/13 0431)   PRN Meds:.morphine injection, ondansetron  (ZOFRAN) IV    Assessment/Plan: 62 y.o. male with previously known cirrhosis, current empyema  I am happy to assume his GI care as outpatient if he is interested. It sounds like he has been getting appropriate liver/GI care at [facility] including recent EGD/colonoscopy and they are helping with his cirrhosis.  For this stay, no need for further GI testing, procedures.    Rachael Fee, MD  10/27/2013, 9:05 AM [facility] Pager [phone]

## 2013-10-27 NOTE — Progress Notes (Addendum)
Subjective:  Pt seen and examined in AM. No acute events overnight. Pt reports he did not sleep well. Pain is much better, it is now only localized to left lateral chest. His breathing has improved and now on [place]. He tolerated clear liquid diet well last night and after lactulose has had 3-4 BM's and normal passage of gas. No fever, chills, cough, nausea, vomiting, or abdominal pain/distension.     Objective: Vital signs in last 24 hours: Filed Vitals:   10/26/13 1601 10/26/13 2030 10/27/13 0000 10/27/13 0442  BP: 116/68 128/67  99/63  Pulse: 89 81  69  Temp: 97.8 F (36.6 C) 97.9 F (36.6 C) 98 F (36.7 C) 98 F (36.7 C)  TempSrc: Oral Oral Oral Oral  Resp: 23 22  17   Height:      Weight:    94.9 kg (209 lb 3.5 oz)  SpO2: 96% 98%  96%   Weight change: 0 kg (0 lb)  Intake/Output Summary (Last 24 hours) at 10/27/13 1610 Last data filed at 10/27/13 0400  Gross per 24 hour  Intake    406 ml  Output   1050 ml  Net   -644 ml    Constitutional: He is oriented to person, place, and time. He appears well-developed and well-nourished. No distress.  Obese, no gynecomastia  HENT:  Head: Normocephalic and atraumatic.  Nose: Nose normal.  Mouth/Throat: Oropharynx is clear and moist. No oropharyngeal exudate.  Eyes: EOM are normal.  Neck: Normal range of motion. Neck supple.  Cardiovascular: Normal rate, regular rhythm and normal heart sounds. Exam reveals no friction rub.  No murmur heard.  Pulmonary/Chest: He has no wheezes. He has no rales. Decreased breath sounds at bases. Poor air movement. He exhibits point tenderness of lateral left chest wall.  Abdominal: Bowel sounds are normal. Soft, non-distended. There is mild LUQ tenderness. There is no guarding. A large umbilical hernia is present.  No caput medusa  No fluid wave Splenomegaly  Musculoskeletal: Normal range of motion. He exhibits +1 b/l LE  edema   Neurological: He is alert and oriented to person, place, and  time.  Skin: Skin is warm and dry. No rash noted. He is not diaphoretic. No erythema. No pallor.  No spider angiomas or telangiectasias  Psychiatric: He has a normal mood and affect. His behavior is normal. Judgment and thought content normal.  No asterixis    Lab Results: Basic Metabolic Panel:  Recent Labs Lab 10/25/13 2315 10/26/13 0448  NA 130* 131*  K 4.3 4.5  CL 98 97  CO2 26 28  GLUCOSE 116* 111*  BUN 42* 44*  CREATININE 1.71* 1.76*  CALCIUM 7.9* 7.9*  MG  --  2.4   Liver Function Tests:  Recent Labs Lab 10/25/13 0614 10/26/13 0448 10/26/13 1615  AST 30 31  --   ALT 22 19  --   ALKPHOS 103 114  --   BILITOT 3.1* 3.1*  --   PROT 6.5 6.3 6.2  ALBUMIN 2.2* 1.9*  --     Recent Labs Lab 10/24/13 0842  LIPASE 32    Recent Labs Lab 10/24/13 0842  AMMONIA 46   CBC:  Recent Labs Lab 10/24/13 0842  10/25/13 1500 10/26/13 0448  WBC 31.1*  < > 37.0* 25.0*  NEUTROABS 27.4*  --   --  20.9*  HGB 14.2  < > 13.7 13.8  HCT 35.1*  < > 37.7* 37.6*  MCV 99.7  < >  100.0 103.6*  PLT 126*  < > 178 153  < > = values in this interval not displayed. Cardiac Enzymes:  Recent Labs Lab 10/24/13 0842 10/25/13 0614  TROPONINI <0.30 <0.30   BNP:  Recent Labs Lab 10/24/13 0842  PROBNP 136.0*   D-Dimer: No results found for this basename: DDIMER,  in the last 168 hours CBG: No results found for this basename: GLUCAP,  in the last 168 hours Hemoglobin A1C: No results found for this basename: HGBA1C,  in the last 168 hours Fasting Lipid Panel:  Recent Labs Lab 10/26/13 0448  CHOL 58  HDL 7*  LDLCALC 35  TRIG 79  CHOLHDL 8.3   Thyroid Function Tests: No results found for this basename: TSH, T4TOTAL, FREET4, T3FREE, THYROIDAB,  in the last 168 hours Coagulation:  Recent Labs Lab 10/24/13 0842 10/26/13 0448  LABPROT 17.7* 17.7*  INR 1.50* 1.50*   Anemia Panel: No results found for this basename: VITAMINB12, FOLATE, FERRITIN, TIBC, IRON,  RETICCTPCT,  in the last 168 hours Urine Drug Screen: Drugs of Abuse     Component Value Date/Time   LABOPIA NONE DETECTED 10/24/2013 1240   COCAINSCRNUR NONE DETECTED 10/24/2013 1240   LABBENZ NONE DETECTED 10/24/2013 1240   AMPHETMU NONE DETECTED 10/24/2013 1240   THCU NONE DETECTED 10/24/2013 1240   LABBARB NONE DETECTED 10/24/2013 1240    Alcohol Level:  Recent Labs Lab 10/24/13 1721  ETH <11   Urinalysis:  Recent Labs Lab 10/24/13 1240 10/26/13 0829  COLORURINE AMBER* YELLOW  LABSPEC 1.024 1.023  PHURINE 5.0 5.0  GLUCOSEU NEGATIVE NEGATIVE  HGBUR SMALL* NEGATIVE  BILIRUBINUR SMALL* NEGATIVE  KETONESUR 15* NEGATIVE  PROTEINUR NEGATIVE NEGATIVE  UROBILINOGEN 0.2 0.2  NITRITE NEGATIVE NEGATIVE  LEUKOCYTESUR TRACE* NEGATIVE     Micro Results: Recent Results (from the past 240 hour(s))  CULTURE, BLOOD (ROUTINE X 2)     Status: None   Collection Time    10/24/13  3:57 PM      Result Value Range Status   Specimen Description BLOOD LEFT ANTECUBITAL   Final   Special Requests BOTTLES DRAWN AEROBIC AND ANAEROBIC 10CC   Final   Culture  Setup Time     Final   Value: 10/24/2013 22:44     Performed at [facility]   Culture     Final   Value: STAPHYLOCOCCUS AUREUS     Note: RIFAMPIN AND GENTAMICIN SHOULD NOT BE USED AS SINGLE DRUGS FOR TREATMENT OF STAPH INFECTIONS.     Note: Gram Stain Report Called to,Read Back By and Verified With: Holland Commons ON 10/25/2013 AT 8:17P BY WILEJ     Performed at [facility]   Report Status PENDING   Incomplete  CULTURE, BLOOD (ROUTINE X 2)     Status: None   Collection Time    10/24/13  4:07 PM      Result Value Range Status   Specimen Description BLOOD HAND LEFT   Final   Special Requests BOTTLES DRAWN AEROBIC ONLY [facility]   Final   Culture  Setup Time     Final   Value: 10/24/2013 22:43     Performed at [facility]   Culture     Final   Value: GRAM POSITIVE COCCI IN CLUSTERS     Note: Gram Stain  Report Called to,Read Back By and Verified With: SAMANTHA CLAXTON 10/26/13 0845 BY SMITHERSJ     Performed at [facility]   Report Status PENDING  Incomplete  MRSA PCR SCREENING     Status: Abnormal   Collection Time    10/24/13  7:55 PM      Result Value Range Status   MRSA by PCR POSITIVE (*) NEGATIVE Final   Comment:            The GeneXpert MRSA Assay (FDA     approved for NASAL specimens     only), is one component of a     comprehensive MRSA colonization     surveillance program. It is not     intended to diagnose MRSA     infection nor to guide or     monitor treatment for     MRSA infections.     RESULT CALLED TO, READ BACK BY AND VERIFIED WITH:     YORK,M RN [id number] 10/24/13 MITCHELL,L  BODY FLUID CULTURE     Status: None   Collection Time    10/26/13  2:21 PM      Result Value Range Status   Specimen Description PLEURAL FLUID LEFT   Final   Special Requests 6.0ML FLUID   Final   Gram Stain     Final   Value: WBC PRESENT, PREDOMINANTLY PMN     NO ORGANISMS SEEN     Performed at [facility]   Culture PENDING   Incomplete   Report Status PENDING   Incomplete   Studies/Results: Ct Abdomen Pelvis Wo Contrast  10/25/2013   CLINICAL DATA:  62 year old male with worsening abdominal pain and distention. Earlier abdominal radiographs for which pneumoperitoneum was difficult to exclude. Recently diagnosed cirrhosis and possible mild pancreatitis. Initial encounter.  EXAM: CT ABDOMEN AND PELVIS WITHOUT CONTRAST  TECHNIQUE: Multidetector CT imaging of the abdomen and pelvis was performed following the standard protocol without intravenous contrast.  COMPARISON:  Abdominal radiographs from 0811 hr the same day. CT Abdomen and Pelvis 10/24/2013.  FINDINGS: No additional contrast administered for this scan.  Interval increased left pleural effusion, moderate to large and with complete left lower lobe atelectasis. No pericardial effusion. No right pleural effusion.  Continued confluent right lower lobe basal segment atelectasis versus consolidation with air bronchograms. This finding might have stimulated pneumoperitoneum on the earlier radiographs.  Gynecomastia.  Stable visualized osseous structures. Disc and endplate degeneration in the lumbar spine.  No pneumoperitoneum.  Trace fluid in an otherwise fact containing left inguinal hernia is stable. No free fluid in the pelvis. Unremarkable bladder. Negative distal colon. Sigmoid diverticulosis but no active inflammation. Oral contrast has reached the proximal sigmoid colon. Negative left colon. Negative transverse colon. Negative right colon, cecum and appendix.  Featureless air and fluid filled small bowel loops with increased small bowel caliber since 10/24/2013, now up to 41 mm diameter. Still, no small bowel wall thickening. No abrupt small bowel transition point.  Nodular cirrhotic liver appears stable. Cholelithiasis. No pericholecystic inflammation. Stable spleen with suggestion of splint around all varices. Stable non contrast kidneys. No hydronephrosis or hydroureter.  The stomach now is decompressed. There is persistent inflammation in the lesser sac primarily about the head of the pancreas and duodenum (series 2, image 41). No associated fluid collection. No abdominal free fluid.  Umbilical hernia containing fat an a small volume of fluid is stable.  IMPRESSION: 1. Negative for pneumoperitoneum.  2. Appearance of bowel most compatible with ileus. Interval dilation of small bowel up to 41 mm diameter, but contrast and gas throughout the colon and no small bowel transition point.  3. Continued  mild to moderate inflammation about the head of the pancreas with secondary involvement of the duodenum. No fluid collection or free fluid.  4. Large layering left pleural effusion has increased. Complete left lower lobe compressive atelectasis. Continued confluent right lung base atelectasis versus consolidation.  5.  Cirrhosis. Cholelithiasis. Sigmoid diverticulosis. Small umbilical and left inguinal hernias containing fat and trace fluid.   Electronically Signed   By: Augusto Gamble M.D.   On: 10/25/2013 11:33   Dg Abd 1 View  10/27/2013   CLINICAL DATA:  Ileus versus small bowel obstruction.  EXAM: ABDOMEN - 1 VIEW  COMPARISON:  10/25/2013 CT scan  FINDINGS: No significant change in the colonic contrast medium. Sigmoid diverticulosis.  Small bowel loops are dilated up to 4.3 cm.  IMPRESSION: 1. Continued small bowel dilatation up to 4.3 cm, favoring ileus. Contrast medium stasis in the colon. 2. Sigmoid diverticulosis.   Electronically Signed   By: [name] M.D.   On: 10/27/2013 00:05   Dg Chest Port 1 View  10/26/2013   CLINICAL DATA:  Status post left thoracentesis.  EXAM: PORTABLE CHEST - 1 VIEW  COMPARISON:  10/1913 chest radiograph CT 0938 hr  FINDINGS: There is a small left pleural effusion, significantly decreased in size compared to the prior study. No pneumothorax is identified. The lungs are hypoinflated, more so than on the prior study. There is slightly improved aeration at the left lung base. Persistent opacity in the left lung base and streaky opacity in the right lung base may represent atelectasis. There is persistent more confluent opacity in the medial right lung base.  IMPRESSION: 1. Greatly decreased size of left pleural effusion following thoracentesis. No pneumothorax identified. 2. Hypoinflated lungs with bibasilar opacities, suggestive of atelectasis although infection is not excluded, particularly in the medial right lung base.   Electronically Signed   By: Sebastian Ache   On: 10/26/2013 14:49   Dg Chest Port 1 View  10/26/2013   CLINICAL DATA:  Pneumonia  EXAM: PORTABLE CHEST - 1 VIEW  COMPARISON:  10/25/2013  FINDINGS: Moderately large left pleural effusion unchanged. Left lower lobe consolidation unchanged.  Progression of right lower lobe airspace disease which may be pneumonia or  atelectasis. No significant effusion on the right. Negative for heart failure.  IMPRESSION: Moderate left effusion and left lower lobe consolidation unchanged. This suspicious for pneumonia and possibly empyema. Chest CT with contrast suggested for further evaluation.  Progression of right lower lobe atelectasis/ infiltrate.   Electronically Signed   By: Marlan Palau M.D.   On: 10/26/2013 10:24   Dg Chest Port 1 View  10/25/2013   CLINICAL DATA:  Upper abdominal pain, shortness of breath  EXAM: PORTABLE CHEST - 1 VIEW  COMPARISON:  10/24/2013  FINDINGS: There is a moderate layering left pleural effusion. There is bibasilar airspace disease which may reflect atelectasis versus developing pneumonia, more focal at the right lung base. There is bilateral interstitial thickening and prominence of the central pulmonary vasculature. There is no pneumothorax. Stable heart size. Unremarkable osseous structures.  IMPRESSION: Moderate layering left pleural effusion. Bibasilar airspace disease which may reflect atelectasis versus developing pneumonia, more focal at the right lung base.   Electronically Signed   By: Elige Ko   On: 10/25/2013 08:31   Dg Abd Portable 2v  10/25/2013   CLINICAL DATA:  62 year old male with abdominal pain and distension. Fever and white count of unclear origin. Initial encounter. History of ventral abdominal hernia.  EXAM: PORTABLE ABDOMEN -  2 VIEW  COMPARISON:  CT Abdomen and Pelvis 10/24/2013 and earlier.  FINDINGS: Supine and left-side-down lateral decubitus views of the abdomen.  Widespread bowel gas, including distally in the rectum. Oral contrast has reached the colon as seen on the earlier CT. Continued gas-filled dilated small bowel loops in the lower abdomen, measuring up to 41 mm.  Unusual shaped gas and contrast collection in the left upper quadrant (arrow on the left-side-down decubitus view), but without gas layering under the right ribs. On the supine view, there is  increased conspicuity of the walls of a small bowel loop located in the right upper quadrant, but this might be related to contrast coating of the loop.  Stable visualized osseous structures.  IMPRESSION: 1. Difficult to fully exclude the possibility of pneumoperitoneum. If clinical suspicion warrants, recommend repeat CT abdomen pelvis (non contrast).  2. Bowel gas pattern most suggestive of ileus. Retained oral contrast in the colon.  Study discussed by telephone with EMILY MULLEN on 10/25/2013 at 08:55 .   Electronically Signed   By: Augusto Gamble M.D.   On: 10/25/2013 09:01   Medications: I have reviewed the patient's current medications. Scheduled Meds: . antiseptic oral rinse  15 mL Mouth Rinse q12n4p  .  ceFAZolin (ANCEF) IV  2 g Intravenous Q8H  . chlorhexidine  15 mL Mouth Rinse BID  . Chlorhexidine Gluconate Cloth  6 each Topical Q0600  . furosemide  40 mg Intravenous Daily  . lactulose  20 g Oral TID  . mupirocin ointment  1 application Nasal BID  . sodium chloride  3 mL Intravenous Q12H  . vancomycin  750 mg Intravenous Q12H   Continuous Infusions: . sodium chloride    . sodium chloride 20 mL/hr (10/27/13 0431)   PRN Meds:.morphine injection, ondansetron (ZOFRAN) IV Assessment/Plan: Principal Problem:   Sepsis Active Problems:   Hep C w/o coma, chronic   Liver cirrhosis   Acute respiratory failure with hypoxia   Ileus, acute   Pancreatitis, acute   Large Pleural effusion on left   AKI (acute kidney injury)   Chronic kidney disease (CKD), stage IV (severe)   Hyponatremia   Hypoalbuminemia   Hyperbilirubinemia   Essential hypertension, benign   Thrombocytopenia, chronic   Assessment: 62 year old man with past medical history of HTN, untreated chronic Hepatitis C infection, liver cirrhosis, and umbilical hernia who presented on 10/24/13 with left sided abdominal pain of 5 day duration.   Plan:    Sepsis secondary to MRSA bacteremia thought to be due to PNA complicated  by acute hypoxic respiratory failure with left exudative effusion requiring thoracentesis - improving  Pt presented with left sided upper quadrant abdominal and left sided chest pain with tachypnea (28-49) and leukocytosis (31K) with left shift (>20% bands) and neutrophilia (88%). Lipase within normal limits however on CT abdomen 11/17 there was presence of mild peripancreatic edema and large left pleural effusion concerning for pancreatitis. He reported no recent alcohol use and ethanol levels were negative. Lipid panel also normal. Pt requiring supplemental oxygen with ABG on 11/18 with normal pH and PaO2 63.7 on 0.50 % venturi mask most likely due to worsening left pleural effusion due to empyema .  Pt without fever and cough but dyspnea (with pain) and large layering pleural effusion and dense bibasilar atelectasis concerning for pneumonia and empyema. Repeat abd/CXR/CTabd on 11/18 with evidence of worsening left layering  pleural effusion and continued mild to moderate inflammation of the head of the pancreas with secondary involvement of  duodenum.  Bedside thoracentesis was performed on 11/19 with 1L of yellow gold pleural fluid with CXR with greatly decreased size of left pleural effusion. Pt with blood culture x2  from 11/17 with Staph Aureus ( x1 with MRSA ) -Blood cultures  x 1 from 11/17 with MRSA (sensitive to vanc), other bottle with Staph A. pending sensitivities -Continue IV vancomycin (Day  4), d/c  IV cefazolin (received 1 day)   -Repeat blood cultures x 2 (11/19) - NGTD  -TEE today due to concern for IE in setting of MRSA--  2012 records WITHOUT esophageal varices, if negative treat bacteremia for 4 weeks after 1st negative blood cultures -NO central line or PICC line placed until 11/22 -Thoracentesis on 11/19--> + Lights' criteria for exudative effusion (protein ratio 0.62, LDH  Ratio 2.85) WBC 3951 (usually empyema 5K to <100K) , pH 8.5  (usually empyema pH<7.2), pleural fluid with WBC's on  gram stain, NGTD on culture   -Awaiting pleural cytology, RF, ADA, ANA, cholesterol, fungus, AFB -Morphine PRN pain -Start to wean oxygen therapy to keep SpO2 > 90% -Monitor CBC - leukocytsois downtrending  -Repeat CXR today  Renal Insufficiency - Cr down-trending to 1.32. Pt presented with Cr of 1.5 with unknown baseline. Possible etiology is pre-renal azotemia vs ATN (sepsis). Post-renal causes less likely considering no hydronpehrosis on CT abdomen. Pt without HTN, edema, or proteinuria on UA to suggest nephrotic syndrome or glomerulonephritis.  -Avoid nephrotoxins (ACEi, ARB, NSAIDS, contrast)  -Continue to monitor BMP in setting of diuresis  -UA 11/19 --> WNL  Ileus -  Improving clinically. Etiology unknown, most likely due to severe illness vs mild pancreatitis.  -Repeat XR Abd 11/19 with ileus  -Advance diet as tolerated  -C diff negative -Limit narcotics   Compensated Liver Cirrhosis - stable. Most likely secondary to past alcohol abuse and untreated viral hepatitis C infection. There was evidence of splenomegaly with varies on CT abdomen and distension on exam without other signs of stigmata of chronic liver disease. Pt's albumin is 2.5, bilirubin 2.4, INR 1.5, ascites that is medically controlled, and encephalopathy medically controlled with Child-Pugh Class C with life expectancy 1-3 years. CT abdomen with findings of cirrhosis with splenomegaly. Pt without ascites, fever, and encephalopathy, however with significant abdominal pain and distension still concern for SBP. Pt with last reported alcoholic drink 1 month ago. Pt reports compliance with diuretic therapy at home. On exam there was distension however hard to appreciate ascites, thus paracentesis was not performed. Ethanol levels were negative and pt reports quitting heavy alcohol use.  -IV lasix 40 mg to PO home lasix -Monitor wt --> 210lb to 209lb -Monitor I &O's--> 1.8 L out since admission -Records obtained from [facility] ---> EGD from 10/2011 without esophageal varices (in pt chart) -Obtain indirect (1.2) and direct bilirubin levels (1.9)  -Prealbumin pending -Continue home PO lactulose 20 g TID   Hypotonic Hyponatremia - Stable. Pt asymptomatic. Baseline unknown. Most likely chronic due to liver cirrhosis. Hypotonic (276 serum osm) hypervolemic hyponatremia due to fluid overload. If euvolemic possibly due to SIADH from PNA.  -PO lasix 40 mg daily    -Monitor BMP   Thrombocytopenia - stable. Pt with splenomegaly and platelet count on admission of 126K (clumping present). Baseline platelet count unknown. No reports of recent bleeding.  -Continue to monitor CBC   Chronic Hepatitis C infection - stable.  Pt reports he was never treated. Complicated by liver cirrhosis. On admission pt with transaminitis (elevated AST 42). No  evidence of rash, glomerulonephritis, or neuropathy to suggest cryoglobulinemia. Pt was HIV negative and UDS was negative.  -Limit tylenol use -Acute hepatitis panel --> HepS Ab neg, Hep A (NR), Hep C (reactive) -Hepatitis A vaccine   Diet: clear liquid DVT PPx: SCD  Code: Full if >50% chance of survival      Dispo: Disposition is deferred at this time, awaiting improvement of current medical problems.  Anticipated discharge in approximately 5 day(s).   The patient does not have a current PCP (No Pcp Per Patient) and does need an [facility] follow-up appointment after discharge.  The patient does not have transportation limitations that hinder transportation to clinic appointments.  .Services Needed at time of discharge: Y = Yes, Blank = No PT:   OT:   RN:   Equipment:   Other:     LOS: 3 days   Otis Brace, MD 10/27/2013, 6:58 AM

## 2013-10-27 NOTE — Progress Notes (Signed)
  Date: 10/27/2013  Patient name: [name] [name]  Medical record number: [id number]  Date of birth: 11/27/1951   This patient has been seen and the plan of care was discussed with the house staff. Please see their note for complete details. I concur with their findings with the following additions/corrections:  Mr. Burright is improved today, oxygenation status much better, appears his symptoms were related to a MRSA pneumonia causing MRSA bacteremia.  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  Last EGD records were obtained from 2012 showing no varices. He is on Vancomycin for his infection.   Inez Catalina, MD 10/27/2013, 1:29 PM

## 2013-10-28 ENCOUNTER — Encounter (HOSPITAL_COMMUNITY): Payer: Self-pay

## 2013-10-28 ENCOUNTER — Encounter (HOSPITAL_COMMUNITY)
Admission: EM | Disposition: A | Discharge status: Home Health | Payer: Self-pay | Source: Home / Self Care | Attending: Internal Medicine

## 2013-10-28 DIAGNOSIS — I059 Rheumatic mitral valve disease, unspecified: Secondary | ICD-10-CM

## 2013-10-28 DIAGNOSIS — R21 Rash and other nonspecific skin eruption: Secondary | ICD-10-CM

## 2013-10-28 DIAGNOSIS — R233 Spontaneous ecchymoses: Secondary | ICD-10-CM

## 2013-10-28 HISTORY — PX: TEE WITHOUT CARDIOVERSION: SHX5443

## 2013-10-28 LAB — CULTURE, BLOOD (ROUTINE X 2)

## 2013-10-28 LAB — CBC WITH DIFFERENTIAL/PLATELET
Basophils Relative: 0 % (ref 0–1)
Eosinophils Relative: 2 % (ref 0–5)
Lymphocytes Relative: 7 % — ABNORMAL LOW (ref 12–46)
Lymphs Abs: 1.2 10*3/uL (ref 0.7–4.0)
MCH: 35.9 pg — ABNORMAL HIGH (ref 26.0–34.0)
MCHC: 35.3 g/dL (ref 30.0–36.0)
MCV: 101.5 fL — ABNORMAL HIGH (ref 78.0–100.0)
Platelets: 174 10*3/uL (ref 150–400)
RBC: 3.43 MIL/uL — ABNORMAL LOW (ref 4.22–5.81)
WBC: 16.6 10*3/uL — ABNORMAL HIGH (ref 4.0–10.5)

## 2013-10-28 LAB — BASIC METABOLIC PANEL
CO2: 28 mEq/L (ref 19–32)
Calcium: 7.7 mg/dL — ABNORMAL LOW (ref 8.4–10.5)
Chloride: 96 mEq/L (ref 96–112)
Glucose, Bld: 122 mg/dL — ABNORMAL HIGH (ref 70–99)
Potassium: 4.1 mEq/L (ref 3.5–5.1)
Sodium: 130 mEq/L — ABNORMAL LOW (ref 135–145)

## 2013-10-28 LAB — ADENOSINE DEAMINASE, FLUID: Adenosine Deaminase, Fluid: 27 U/L — ABNORMAL HIGH (ref <7.6)

## 2013-10-28 SURGERY — ECHOCARDIOGRAM, TRANSESOPHAGEAL
Anesthesia: Moderate Sedation

## 2013-10-28 MED ORDER — MIDAZOLAM HCL 5 MG/ML IJ SOLN
INTRAMUSCULAR | Status: AC
  Filled 2013-10-28: qty 2

## 2013-10-28 MED ORDER — DIPHENHYDRAMINE HCL 50 MG PO CAPS
50.0000 mg | ORAL_CAPSULE | Freq: Once | ORAL | Status: AC
  Administered 2013-10-28: 50 mg via ORAL
  Filled 2013-10-28: qty 1

## 2013-10-28 MED ORDER — MIDAZOLAM HCL 10 MG/2ML IJ SOLN
INTRAMUSCULAR | Status: DC | PRN
  Administered 2013-10-28 (×2): 2 mg via INTRAVENOUS

## 2013-10-28 MED ORDER — DIPHENHYDRAMINE HCL 25 MG PO CAPS
50.0000 mg | ORAL_CAPSULE | Freq: Four times a day (QID) | ORAL | Status: DC | PRN
  Administered 2013-10-29: 50 mg via ORAL
  Filled 2013-10-28: qty 2

## 2013-10-28 MED ORDER — DIPHENHYDRAMINE HCL 50 MG/ML IJ SOLN
INTRAMUSCULAR | Status: AC
  Filled 2013-10-28: qty 1

## 2013-10-28 MED ORDER — BUTAMBEN-TETRACAINE-BENZOCAINE 2-2-14 % EX AERO
INHALATION_SPRAY | CUTANEOUS | Status: DC | PRN
  Administered 2013-10-28: 2 via TOPICAL

## 2013-10-28 MED ORDER — FENTANYL CITRATE 0.05 MG/ML IJ SOLN
INTRAMUSCULAR | Status: AC
  Filled 2013-10-28: qty 2

## 2013-10-28 MED ORDER — FENTANYL CITRATE 0.05 MG/ML IJ SOLN
INTRAMUSCULAR | Status: DC | PRN
  Administered 2013-10-28 (×2): 25 ug via INTRAVENOUS

## 2013-10-28 NOTE — Progress Notes (Signed)
  Date: 10/28/2013  Patient name: [name] [name]  Medical record number: [id number]  Date of birth: 12/07/1951   This patient has been seen and the plan of care was discussed with the house staff. Please see their note for complete details. I concur with their findings with the following additions/corrections:  New rash today, petechial and confluent over palms, hands, feet, elbows and groin.  He does note for me that it is somewhat itchy on the feet.  Platelets are stable.  Possible drug reaction vs. Vasculitis.  He does have Hep C cirrhosis as a confounding factor, however, he has received multiple abx since being in the hospital.  Will monitor for today as mostly asymptomatic, agree with benadryl, reassess with thorough skin exam daily for any changes, ulceration, etc.  ID is following.   He had a TEE today for evaluation of endocarditis.     Inez Catalina, MD 10/28/2013, 3:34 PM

## 2013-10-28 NOTE — Consult Note (Signed)
PULMONARY  / CRITICAL CARE MEDICINE  Name: Ewing Fandino MRN: [id number] DOB: 1951-05-19    ADMISSION DATE:  10/24/2013 CONSULTATION DATE:  10/25/13  REFERRING MD :  Dr. Criselda Peaches PRIMARY SERVICE:  Internal medicine teaching service  CHIEF COMPLAINT:  Abdominal pain, distention, and shortness of breath   BRIEF PATIENT DESCRIPTION: 62 y.o. With HTN, untreated chronic hep C, cirrhosis and umbilical hernia was admitted 11/17 for hypovolemia and left sided abdominal pain secondary to a UTI diagnosed at [facility] hospital in [place].  PCCM called to evaluate pleural effusion and ascites.  SIGNIFICANT EVENTS / STUDIES:  11/12 diagnosed with UTI at [facility] in [place] given Cipro 11/17 admitted 11/18 CT abd and pelvis Cirrhosis, large left pleural effusion, slightly dilated bowel loops no obstruction likely an ileus.  Cholelithiasis, and inflammation of pancreatic head. 11/19 Blood cultures show Staph, ID consulted 11/19 Thoracentesis, marigold cloudy fluid, pH 8.5, LD 906, exudative, no current growth on cultures, large WBCs   LINES / TUBES: PIV  CULTURES: 11/17 Blood x2 >> Gram + cocci in pairs, Staph 11/19 Pleural fluid cx >> WBCs no organisms  11/19 Pleural fungal cx >> NGTD  ANTIBIOTICS: 11/18 Vanc >> 11/18 Zosyn >> 11/19 11/19 Ancef >> 11/19   SUBJECTIVE: Had TEE today  VITAL SIGNS: Temp:  [97.3 F (36.3 C)-98.2 F (36.8 C)] 97.7 F (36.5 C) (11/21 0928) Pulse Rate:  [71-87] 78 (11/21 0928) Resp:  [15-27] 18 (11/21 0928) BP: (94-121)/(61-84) 105/67 mmHg (11/21 0928) SpO2:  [93 %-99 %] 96 % (11/21 0928) Weight:  [208 lb 5.4 oz (94.5 kg)] 208 lb 5.4 oz (94.5 kg) (11/21 0500)  PHYSICAL EXAMINATION: General:  Chronically ill appearing male, resting comfortably Neuro:  A&Ox4, appropriate conversation skills, moves extremitiesx4 HEENT:  Mild scleral icterus, MM moist and pink Neck:  No JVD Cardiovascular:  RRR, no M  Lungs:  Diminished breath sounds bibasilar, L>R, no appreciable  R/R/W Abdomen:  +BS, mildly protuberant, mildly tender palpation left upper quadrant, large umbilical hernia, reducible Musculoskeletal:  No gross deformities, mild BLE swelling Skin:  Mildly jaundiced, no obvious breakdowns  Recent Labs Lab 10/27/13 0854 10/27/13 1500 10/28/13 0615  NA 129* 126* 130*  K 4.2 4.8 4.1  CL 95* 95* 96  CO2 26 23 28   BUN 41* 41* 38*  CREATININE 1.32 1.25 1.15  GLUCOSE 139* 137* 122*    Recent Labs Lab 10/25/13 1500 10/26/13 0448 10/27/13 0625  HGB 13.7 13.8 13.1  HCT 37.7* 37.6* 36.5*  WBC 37.0* 25.0* 19.1*  PLT 178 153 146*   Dg Abd 1 View  10/27/2013   CLINICAL DATA:  Ileus versus small bowel obstruction.  EXAM: ABDOMEN - 1 VIEW  COMPARISON:  10/25/2013 CT scan  FINDINGS: No significant change in the colonic contrast medium. Sigmoid diverticulosis.  Small bowel loops are dilated up to 4.3 cm.  IMPRESSION: 1. Continued small bowel dilatation up to 4.3 cm, favoring ileus. Contrast medium stasis in the colon. 2. Sigmoid diverticulosis.   Electronically Signed   By: [name] M.D.   On: 10/27/2013 00:05   Dg Chest Port 1 View  10/26/2013   CLINICAL DATA:  Status post left thoracentesis.  EXAM: PORTABLE CHEST - 1 VIEW  COMPARISON:  10/1913 chest radiograph CT 0938 hr  FINDINGS: There is a small left pleural effusion, significantly decreased in size compared to the prior study. No pneumothorax is identified. The lungs are hypoinflated, more so than on the prior study. There is slightly improved aeration at the left lung  base. Persistent opacity in the left lung base and streaky opacity in the right lung base may represent atelectasis. There is persistent more confluent opacity in the medial right lung base.  IMPRESSION: 1. Greatly decreased size of left pleural effusion following thoracentesis. No pneumothorax identified. 2. Hypoinflated lungs with bibasilar opacities, suggestive of atelectasis although infection is not excluded, particularly in the  medial right lung base.   Electronically Signed   By: Sebastian Ache   On: 10/26/2013 14:49    ASSESSMENT / PLAN: - Pleural effusion- exudative effusion, no growth currently on cx.  - Effusion does not meet criteria for empyema.  - Differential for effusion includes parapneumonic effusion,       - For parapneumonic effusion patient is currently being treated with Vanc which should provide adequate coverage  - MRSA Bactermia  - ID following, will defer abx choices to ID.  - abx per ID  Toni Amend [facility] Physician Assistant Student 10/28/13, 10:12 AM  Today's summary: Malignancy is ruled out based on cytology report from pathology.  Based on PMNs on cell differential and elevated LDH this is likely due to parapneumonic effusion or migration of SBP due to left diaphragm defects.  Vanc will cover parapneumonic effusion treatment.  TEE being performed today.  May be worth exploring the option of paracentesis if a fluid pocket large enough is present.  PCCM has no further recommendations at this time.  We will be available if needed.  Thanks for the opportunity to consult.  Patient seen and examined, agree with above note.  I dictated the care and orders written for this patient under my direction.  Alyson Reedy, MD [phone]

## 2013-10-28 NOTE — H&P (View-Only) (Signed)
  Date: 10/27/2013  Patient name: [name] [name]  Medical record number: [id number]  Date of birth: 11/04/1951   This patient has been seen and the plan of care was discussed with the house staff. Please see their note for complete details. I concur with their findings with the following additions/corrections:  Mr. Gavin is improved today, oxygenation status much better, appears his symptoms were related to a MRSA pneumonia causing MRSA bacteremia.  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  Last EGD records were obtained from 2012 showing no varices. He is on Vancomycin for his infection.   Emily B Mullen, MD 10/27/2013, 1:29 PM 

## 2013-10-28 NOTE — Progress Notes (Signed)
Subjective:  Pt seen and examined. Per nurse last night there was evidence of petechiae that  today has worsened and is present over pt's palms, elbows, dorsum of feet, and groin area. Per pt he noticed rash on his left finger and then spread after he had blood drawn(?).  He received vancomycin in the early morning at [facility].  He reports it does not itch and is not raised but is spreading. Pt reports mild epigastric pain and nausea since eating yesterday.  His left sided chest tenderness has improved. He denies fever, dyspnea, cough, chest pain,  joint pain, neuropathy, bleeding,  extremity pain, or swelling.  He has been having multiple normal BM's and normal urination. He was able to ambulate to chair yesterday without difficulty.      Objective: Vital signs in last 24 hours: Filed Vitals:   10/27/13 2039 10/28/13 0000 10/28/13 0445 10/28/13 0500  BP: 95/63 94/61 119/72   Pulse: 87 76 81   Temp: 97.8 F (36.6 C) 97.3 F (36.3 C) 97.7 F (36.5 C)   TempSrc: Oral Axillary Oral   Resp: 21 17 15    Height:      Weight:    208 lb 5.4 oz (94.5 kg)  SpO2: 98% 93% 94%    Weight change: -14.1 oz (-0.4 kg)  Intake/Output Summary (Last 24 hours) at 10/28/13 0737 Last data filed at 10/28/13 0500  Gross per 24 hour  Intake    370 ml  Output    450 ml  Net    -80 ml    Constitutional: He is oriented to person, place, and time. He appears well-developed and well-nourished. No distress.  Obese, no gynecomastia  HENT:  Head: Normocephalic and atraumatic.  Nose: Nose normal.  Mouth/Throat: Oropharynx is clear and moist. No oropharyngeal exudate.  Eyes: EOM are normal.  Neck: Normal range of motion. Neck supple.  Cardiovascular: Normal rate, regular rhythm and normal heart sounds. Exam reveals no friction rub.  No murmur heard.  Pulmonary/Chest: He has no wheezes. He has no rales. Decreased breath sounds at bases. Better air movement. He exhibits point tenderness of lateral left chest  wall.  Abdominal: Bowel sounds are normal. Soft, non-distended. There is mild LUQ tenderness. There is no guarding. A large umbilical hernia is present.  No caput medusa  No fluid wave Splenomegaly  Musculoskeletal: Normal range of motion. He exhibits +1 b/l LE  edema   Neurological: He is alert and oriented to person, place, and time.  Skin: Skin is warm and dry. He is not diaphoretic. No erythema. Mild jaundice .  No spider angiomas or telangiectasias. Prominent petechial rash overlying both palms, elbows, dorsum of feet, and groin area.   Psychiatric: He has a normal mood and affect. His behavior is normal. Judgment and thought content normal.  No asterixis    Lab Results: Basic Metabolic Panel:  Recent Labs Lab 10/27/13 0854 10/27/13 1500 10/28/13 0615  NA 129* 126* 130*  K 4.2 4.8 4.1  CL 95* 95* 96  CO2 26 23 28   GLUCOSE 139* 137* 122*  BUN 41* 41* 38*  CREATININE 1.32 1.25 1.15  CALCIUM 7.8* 7.5* 7.7*  MG 2.4 2.4  --    Liver Function Tests:  Recent Labs Lab 10/25/13 0614 10/26/13 0448 10/26/13 1615  AST 30 31  --   ALT 22 19  --   ALKPHOS 103 114  --   BILITOT 3.1* 3.1*  --   PROT 6.5 6.3  6.2  ALBUMIN 2.2* 1.9*  --     Recent Labs Lab 10/24/13 0842  LIPASE 32    Recent Labs Lab 10/24/13 0842  AMMONIA 46   CBC:  Recent Labs Lab 10/26/13 0448 10/27/13 0625  WBC 25.0* 19.1*  NEUTROABS 20.9* 16.1*  HGB 13.8 13.1  HCT 37.6* 36.5*  MCV 103.6* 100.6*  PLT 153 146*   Cardiac Enzymes:  Recent Labs Lab 10/24/13 0842 10/25/13 0614  TROPONINI <0.30 <0.30   BNP:  Recent Labs Lab 10/24/13 0842  PROBNP 136.0*   D-Dimer: No results found for this basename: DDIMER,  in the last 168 hours CBG: No results found for this basename: GLUCAP,  in the last 168 hours Hemoglobin A1C: No results found for this basename: HGBA1C,  in the last 168 hours Fasting Lipid Panel:  Recent Labs Lab 10/26/13 0448  10/27/13 1500  CHOL 58  < > 49  HDL  7*  --   --   LDLCALC 35  --   --   TRIG 79  --   --   CHOLHDL 8.3  --   --   < > = values in this interval not displayed. Thyroid Function Tests: No results found for this basename: TSH, T4TOTAL, FREET4, T3FREE, THYROIDAB,  in the last 168 hours Coagulation:  Recent Labs Lab 10/24/13 0842 10/26/13 0448  LABPROT 17.7* 17.7*  INR 1.50* 1.50*   Anemia Panel: No results found for this basename: VITAMINB12, FOLATE, FERRITIN, TIBC, IRON, RETICCTPCT,  in the last 168 hours Urine Drug Screen: Drugs of Abuse     Component Value Date/Time   LABOPIA NONE DETECTED 10/24/2013 1240   COCAINSCRNUR NONE DETECTED 10/24/2013 1240   LABBENZ NONE DETECTED 10/24/2013 1240   AMPHETMU NONE DETECTED 10/24/2013 1240   THCU NONE DETECTED 10/24/2013 1240   LABBARB NONE DETECTED 10/24/2013 1240    Alcohol Level:  Recent Labs Lab 10/24/13 1721  ETH <11   Urinalysis:  Recent Labs Lab 10/24/13 1240 10/26/13 0829  COLORURINE AMBER* YELLOW  LABSPEC 1.024 1.023  PHURINE 5.0 5.0  GLUCOSEU NEGATIVE NEGATIVE  HGBUR SMALL* NEGATIVE  BILIRUBINUR SMALL* NEGATIVE  KETONESUR 15* NEGATIVE  PROTEINUR NEGATIVE NEGATIVE  UROBILINOGEN 0.2 0.2  NITRITE NEGATIVE NEGATIVE  LEUKOCYTESUR TRACE* NEGATIVE     Micro Results: Recent Results (from the past 240 hour(s))  CULTURE, BLOOD (ROUTINE X 2)     Status: None   Collection Time    10/24/13  3:57 PM      Result Value Range Status   Specimen Description BLOOD LEFT ANTECUBITAL   Final   Special Requests BOTTLES DRAWN AEROBIC AND ANAEROBIC 10CC   Final   Culture  Setup Time     Final   Value: 10/24/2013 22:44     Performed at [facility]   Culture     Final   Value: METHICILLIN RESISTANT STAPHYLOCOCCUS AUREUS     Note: RIFAMPIN AND GENTAMICIN SHOULD NOT BE USED AS SINGLE DRUGS FOR TREATMENT OF STAPH INFECTIONS. This organism DOES NOT demonstrate inducible Clindamycin resistance in vitro. CRITICAL RESULT CALLED TO, READ BACK BY AND VERIFIED  WITH: Gae Gallop RN      10/27/13 830AM BY INGRAM A      Note: Gram Stain Report Called to,Read Back By and Verified With: [place] [facility] ON 10/25/2013 AT 8:17P BY Serafina Mitchell     Performed at [facility]   Report Status 10/27/2013 FINAL   Final   Organism ID, Bacteria METHICILLIN RESISTANT STAPHYLOCOCCUS AUREUS  Final  CULTURE, BLOOD (ROUTINE X 2)     Status: None   Collection Time    10/24/13  4:07 PM      Result Value Range Status   Specimen Description BLOOD HAND LEFT   Final   Special Requests BOTTLES DRAWN AEROBIC ONLY [facility]   Final   Culture  Setup Time     Final   Value: 10/24/2013 22:43     Performed at [facility]   Culture     Final   Value: METHICILLIN RESISTANT STAPHYLOCOCCUS AUREUS     Note: RIFAMPIN AND GENTAMICIN SHOULD NOT BE USED AS SINGLE DRUGS FOR TREATMENT OF STAPH INFECTIONS. CRITICAL RESULT CALLED TO, READ BACK BY AND VERIFIED WITH: ANA A. BY INGRAM A 11/20     Note: Gram Stain Report Called to,Read Back By and Verified With: SAMANTHA CLAXTON 10/26/13 0845 BY SMITHERSJ     Performed at [facility]   Report Status PENDING   Incomplete  MRSA PCR SCREENING     Status: Abnormal   Collection Time    10/24/13  7:55 PM      Result Value Range Status   MRSA by PCR POSITIVE (*) NEGATIVE Final   Comment:            The GeneXpert MRSA Assay (FDA     approved for NASAL specimens     only), is one component of a     comprehensive MRSA colonization     surveillance program. It is not     intended to diagnose MRSA     infection nor to guide or     monitor treatment for     MRSA infections.     RESULT CALLED TO, READ BACK BY AND VERIFIED WITH:     YORK,M RN [id number] 10/24/13 MITCHELL,L  CULTURE, BLOOD (ROUTINE X 2)     Status: None   Collection Time    10/26/13  1:35 PM      Result Value Range Status   Specimen Description BLOOD LEFT ANTECUBITAL   Final   Special Requests BOTTLES DRAWN AEROBIC AND ANAEROBIC 10CC   Final   Culture  Setup Time     Final    Value: 10/26/2013 20:14     Performed at [facility]   Culture     Final   Value:        BLOOD CULTURE RECEIVED NO GROWTH TO DATE CULTURE WILL BE HELD FOR 5 DAYS BEFORE ISSUING A FINAL NEGATIVE REPORT     Performed at [facility]   Report Status PENDING   Incomplete  CULTURE, BLOOD (ROUTINE X 2)     Status: None   Collection Time    10/26/13  1:45 PM      Result Value Range Status   Specimen Description BLOOD LEFT HAND   Final   Special Requests     Final   Value: BOTTLES DRAWN AEROBIC AND ANAEROBIC BLUE 10CC RED 7CC   Culture  Setup Time     Final   Value: 10/26/2013 20:14     Performed at [facility]   Culture     Final   Value:        BLOOD CULTURE RECEIVED NO GROWTH TO DATE CULTURE WILL BE HELD FOR 5 DAYS BEFORE ISSUING A FINAL NEGATIVE REPORT     Performed at [facility]   Report Status PENDING   Incomplete  CLOSTRIDIUM DIFFICILE BY PCR  Status: None   Collection Time    10/26/13  2:21 PM      Result Value Range Status   C difficile by pcr NEGATIVE  NEGATIVE Final  AFB CULTURE WITH SMEAR     Status: None   Collection Time    10/26/13  2:21 PM      Result Value Range Status   Specimen Description PLEURAL FLUID LEFT   Final   Special Requests 6.0ML   Final   ACID FAST SMEAR     Final   Value: NO ACID FAST BACILLI SEEN     Performed at [facility]   Culture     Final   Value: CULTURE WILL BE EXAMINED FOR 6 WEEKS BEFORE ISSUING A FINAL REPORT     Performed at [facility]   Report Status PENDING   Incomplete  BODY FLUID CULTURE     Status: None   Collection Time    10/26/13  2:21 PM      Result Value Range Status   Specimen Description PLEURAL FLUID LEFT   Final   Special Requests 6.0ML FLUID   Final   Gram Stain     Final   Value: WBC PRESENT, PREDOMINANTLY PMN     NO ORGANISMS SEEN     Performed at [facility]   Culture     Final   Value: NO GROWTH 1 DAY     Performed at [facility]    Report Status PENDING   Incomplete  FUNGUS CULTURE W SMEAR     Status: None   Collection Time    10/26/13  2:21 PM      Result Value Range Status   Specimen Description PLEURAL FLUID LEFT   Final   Special Requests 6.0ML FLUID   Final   Fungal Smear     Final   Value: NO YEAST OR FUNGAL ELEMENTS SEEN     Performed at [facility]   Culture     Final   Value: CULTURE IN PROGRESS FOR FOUR WEEKS     Performed at [facility]   Report Status PENDING   Incomplete   Studies/Results: Dg Abd 1 View  10/27/2013   CLINICAL DATA:  Ileus versus small bowel obstruction.  EXAM: ABDOMEN - 1 VIEW  COMPARISON:  10/25/2013 CT scan  FINDINGS: No significant change in the colonic contrast medium. Sigmoid diverticulosis.  Small bowel loops are dilated up to 4.3 cm.  IMPRESSION: 1. Continued small bowel dilatation up to 4.3 cm, favoring ileus. Contrast medium stasis in the colon. 2. Sigmoid diverticulosis.   Electronically Signed   By: [name] M.D.   On: 10/27/2013 00:05   Dg Chest Port 1 View  10/26/2013   CLINICAL DATA:  Status post left thoracentesis.  EXAM: PORTABLE CHEST - 1 VIEW  COMPARISON:  10/1913 chest radiograph CT 0938 hr  FINDINGS: There is a small left pleural effusion, significantly decreased in size compared to the prior study. No pneumothorax is identified. The lungs are hypoinflated, more so than on the prior study. There is slightly improved aeration at the left lung base. Persistent opacity in the left lung base and streaky opacity in the right lung base may represent atelectasis. There is persistent more confluent opacity in the medial right lung base.  IMPRESSION: 1. Greatly decreased size of left pleural effusion following thoracentesis. No pneumothorax identified. 2. Hypoinflated lungs with bibasilar opacities, suggestive of atelectasis although infection is not excluded,  particularly in the medial right lung base.   Electronically Signed   By: Sebastian Ache   On:  10/26/2013 14:49   Dg Chest Port 1 View  10/26/2013   CLINICAL DATA:  Pneumonia  EXAM: PORTABLE CHEST - 1 VIEW  COMPARISON:  10/25/2013  FINDINGS: Moderately large left pleural effusion unchanged. Left lower lobe consolidation unchanged.  Progression of right lower lobe airspace disease which may be pneumonia or atelectasis. No significant effusion on the right. Negative for heart failure.  IMPRESSION: Moderate left effusion and left lower lobe consolidation unchanged. This suspicious for pneumonia and possibly empyema. Chest CT with contrast suggested for further evaluation.  Progression of right lower lobe atelectasis/ infiltrate.   Electronically Signed   By: Marlan Palau M.D.   On: 10/26/2013 10:24   Medications: I have reviewed the patient's current medications. Scheduled Meds: . antiseptic oral rinse  15 mL Mouth Rinse q12n4p  . chlorhexidine  15 mL Mouth Rinse BID  . Chlorhexidine Gluconate Cloth  6 each Topical Q0600  . furosemide  40 mg Oral Daily  . hepatitis A virus (PF) vaccine  1 mL Intramuscular Once  . lactulose  20 g Oral TID  . mupirocin ointment  1 application Nasal BID  . sodium chloride  3 mL Intravenous Q12H  . spironolactone  100 mg Oral Daily  . vancomycin  750 mg Intravenous Q12H   Continuous Infusions: . sodium chloride    . sodium chloride 20 mL/hr (10/27/13 0431)   PRN Meds:.morphine injection, ondansetron (ZOFRAN) IV Assessment/Plan: Principal Problem:   Sepsis Active Problems:   Hep C w/o coma, chronic   Liver cirrhosis   Acute respiratory failure with hypoxia   Ileus, acute   Pancreatitis, acute   Large Pleural effusion on left   AKI (acute kidney injury)   Chronic kidney disease (CKD), stage IV (severe)   Hyponatremia   Hypoalbuminemia   Hyperbilirubinemia   Essential hypertension, benign   Thrombocytopenia, chronic   Assessment: 62 year old man with past medical history of HTN, untreated chronic Hepatitis C infection, liver cirrhosis, and  umbilical hernia who presented on 10/24/13 with left sided abdominal pain of 5 day duration.   Plan:    Sepsis secondary to MRSA bacteremia thought to be due to PNA complicated by acute hypoxic respiratory failure with left exudative effusion requiring thoracentesis - improving  Pt presented with left sided upper quadrant abdominal and left sided chest pain with tachypnea (28-49) and leukocytosis (31K) with left shift (>20% bands) and neutrophilia (88%). Lipase within normal limits however on CT abdomen 11/17 there was presence of mild peripancreatic edema and large left pleural effusion concerning for pancreatitis. He reported no recent alcohol use and ethanol levels were negative. Lipid panel also normal. Pt requiring supplemental oxygen with ABG on 11/18 with normal pH and PaO2 63.7 on 0.50 % venturi mask most likely due to worsening left pleural effusion due to empyema .  Pt without fever and cough but dyspnea (with pain) and large layering pleural effusion and dense bibasilar atelectasis concerning for pneumonia and empyema. Repeat abd/CXR/CTabd on 11/18 with evidence of worsening left layering  pleural effusion and continued mild to moderate inflammation of the head of the pancreas with secondary involvement of duodenum.  Bedside thoracentesis was performed on 11/19 with 1L of yellow gold pleural fluid with CXR with greatly decreased size of left pleural effusion. Pt with blood culture x2  from 11/17 with Staph Aureus ( x1 with MRSA ) -Blood cultures  x  2 from 11/17 with MRSA (sensitive to vanc) pending sensitivities -Continue IV vancomycin (Day  5)   -Repeat blood cultures x 2 (11/19) - NGTD  -TEE 11/21 normal --> no SBE  -Treat bacteremia for 4 weeks after 1st negative blood cultures -NO central line or PICC line placed until 11/22 -Thoracentesis on 11/19--> + Lights' criteria for exudative effusion (protein ratio 0.62, LDH  Ratio 2.85) WBC 3951 (usually empyema 5K to <100K) , pH 8.5  (usually  empyema pH<7.2), pleural fluid with WBC's on gram stain, NGTD on culture   -Awaiting pleural cytology, RF, ADA, ANA, cholesterol, fungus (NGTD), AFB (NGTD) -Morphine PRN pain -Start to wean oxygen therapy from 6 L  to keep SpO2 > 90% -Monitor CBC - leukocytsois downtrending   Rash - Pt with development of petechial(?) rash on palms, elbows, dorsum of feet, and groin area concerning for drug reaction. Pt received cefazolin (2 doses on 11/19) and 1 dose on 11/20 and 5 days of vancomycin. Distribution of rash is not consistent with red man's syndrome due to vancomycin infusion reaction. Most likely due to cefazolin than vancomycin.  - Appreciate ID input  -Continue to monitor - if continues to worsens with vancomycin, consider switching to Linezolid  - Administer Benadryl PRN   Renal Insufficiency - improved. Cr down-trending to 1.15. Pt presented with Cr of 1.5 with unknown baseline. Possible etiology is pre-renal azotemia vs ATN (sepsis). Post-renal causes less likely considering no hydronpehrosis on CT abdomen. Pt without HTN, edema, or proteinuria on UA to suggest nephrotic syndrome or glomerulonephritis.  -Avoid nephrotoxins (ACEi, ARB, NSAIDS, contrast)  -Continue to monitor BMP in setting of diuresis  -UA 11/19 --> WNL  Ileus -  Improving clinically. Etiology unknown, most likely due to severe illness vs mild pancreatitis.  -Repeat XR Abd 11/19 with ileus  -Advance diet as tolerated  -C diff negative -Limit narcotics   Compensated Liver Cirrhosis - stable. Most likely secondary to past alcohol abuse and untreated viral hepatitis C infection. There was evidence of splenomegaly with varies on CT abdomen and distension on exam without other signs of stigmata of chronic liver disease. Pt's albumin is 2.5, bilirubin 2.4, INR 1.5, ascites that is medically controlled, and encephalopathy medically controlled with Child-Pugh Class C with life expectancy 1-3 years. CT abdomen with findings of  cirrhosis with splenomegaly. Pt without ascites, fever, and encephalopathy, however with significant abdominal pain and distension still concern for SBP. Pt with last reported alcoholic drink 1 month ago. Pt reports compliance with diuretic therapy at home. On exam there was distension however hard to appreciate ascites, thus paracentesis was not performed. Ethanol levels were negative and pt reports quitting heavy alcohol use.  - Continue PO home lasix 40 mg daily  -Monitor wt --> 210lb to 208 lb since admission -Monitor I &O's--> 2.6 L out since admission -Records obtained from [facility] ---> EGD from 10/2011 without esophageal varices (in pt chart) -Obtain indirect (1.2) and direct bilirubin levels (1.9)  -Prealbumin - low 1.4 indicating element of  malnutrition  -Continue home PO lactulose 20 g TID   Hypotonic Hyponatremia - Stable. Pt asymptomatic. Baseline unknown. Most likely chronic due to liver cirrhosis. Hypotonic (276 serum osm) hypervolemic hyponatremia due to fluid overload. If euvolemic possibly due to SIADH from PNA.  -PO lasix 40 mg daily    -Monitor BMP   Thrombocytopenia - improved. Pt with splenomegaly and platelet count on admission of 126K (clumping present). Baseline platelet count unknown. No reports of recent  bleeding.  -Continue to monitor CBC   Chronic Hepatitis C infection - stable.  Pt reports he was never treated. Complicated by liver cirrhosis. On admission pt with transaminitis (elevated AST 42). No evidence of rash, glomerulonephritis, or neuropathy to suggest cryoglobulinemia. Pt was HIV negative and UDS was negative.  -Limit tylenol use -Acute hepatitis panel --> HepS Ab neg, Hep A (NR), Hep C (reactive) -Hepatitis A vaccine   Diet: clear liquid DVT PPx: SCD  Code: Full if >50% chance of survival      Dispo: Disposition is deferred at this time, awaiting improvement of current medical problems.  Anticipated discharge in approximately 5 day(s).   The  patient does not have a current PCP (No Pcp Per Patient) and does need an [facility] hospital follow-up appointment after discharge.  The patient does not have transportation limitations that hinder transportation to clinic appointments.  .Services Needed at time of discharge: Y = Yes, Blank = No PT:   OT:   RN:   Equipment:   Other:     LOS: 4 days   Otis Brace, MD 10/28/2013, 7:37 AM

## 2013-10-28 NOTE — Interval H&P Note (Signed)
History and Physical Interval Note:  10/28/2013 9:06 AM  [name] [name]  has presented today for surgery, with the diagnosis of BACTEREMIA  The various methods of treatment have been discussed with the patient and family. After consideration of risks, benefits and other options for treatment, the patient has consented to  Procedure(s): TRANSESOPHAGEAL ECHOCARDIOGRAM (TEE) (N/A) as a surgical intervention .  The patient's history has been reviewed, patient examined, no change in status, stable for surgery.  I have reviewed the patient's chart and labs.  Questions were answered to the patient's satisfaction.     Charlton Haws

## 2013-10-28 NOTE — Progress Notes (Signed)
  Echocardiogram Echocardiogram Transesophageal has been performed.  [place]ian Co 10/28/2013, 10:53 AM

## 2013-10-28 NOTE — CV Procedure (Signed)
TEE:  50 ug fentanyl  4 mg versed   Normal EF No SBE Mild MR Normal AV, TV , PV Normal RV No effusion No PFO/ASD  [name] [name]

## 2013-10-28 NOTE — Progress Notes (Signed)
[facility]    Subjective: No new complaints   Antibiotics:  Anti-infectives   Start     Dose/Rate Route Frequency Ordered Stop   10/26/13 1300  ceFAZolin (ANCEF) IVPB 2 g/50 mL premix  Status:  Discontinued     2 g 100 mL/hr over 30 Minutes Intravenous 3 times per day 10/26/13 1206 10/27/13 0955   10/25/13 1230  metroNIDAZOLE (FLAGYL) IVPB 500 mg  Status:  Discontinued     500 mg 100 mL/hr over 60 Minutes Intravenous Every 8 hours 10/25/13 1123 10/26/13 0914   10/25/13 0600  vancomycin (VANCOCIN) IVPB 750 mg/150 ml premix     750 mg 150 mL/hr over 60 Minutes Intravenous Every 12 hours 10/24/13 1611     10/24/13 2200  piperacillin-tazobactam (ZOSYN) IVPB 3.375 g  Status:  Discontinued     3.375 g 12.5 mL/hr over 240 Minutes Intravenous 3 times per day 10/24/13 1611 10/26/13 1148   10/24/13 1600  vancomycin (VANCOCIN) IVPB 1000 mg/200 mL premix     1,000 mg 200 mL/hr over 60 Minutes Intravenous  Once 10/24/13 1548 10/24/13 1757   10/24/13 1600  piperacillin-tazobactam (ZOSYN) IVPB 4.5 g  Status:  Discontinued     4.5 g 200 mL/hr over 30 Minutes Intravenous  Once 10/24/13 1548 10/24/13 1555   10/24/13 1600  piperacillin-tazobactam (ZOSYN) IVPB 3.375 g     3.375 g 100 mL/hr over 30 Minutes Intravenous  Once 10/24/13 1556 10/24/13 1706      Medications: Scheduled Meds: . antiseptic oral rinse  15 mL Mouth Rinse q12n4p  . chlorhexidine  15 mL Mouth Rinse BID  . Chlorhexidine Gluconate Cloth  6 each Topical Q0600  . furosemide  40 mg Oral Daily  . hepatitis A virus (PF) vaccine  1 mL Intramuscular Once  . lactulose  20 g Oral TID  . mupirocin ointment  1 application Nasal BID  . sodium chloride  3 mL Intravenous Q12H  . spironolactone  100 mg Oral Daily  . vancomycin  750 mg Intravenous Q12H   Continuous Infusions: . sodium chloride     PRN Meds:.diphenhydrAMINE, morphine injection, ondansetron (ZOFRAN) IV   Objective: Weight change:  -14.1 oz (-0.4 kg)  Intake/Output Summary (Last 24 hours) at 10/28/13 1601 Last data filed at 10/28/13 0500  Gross per 24 hour  Intake    370 ml  Output    450 ml  Net    -80 ml   Blood pressure 95/64, pulse 77, temperature 98.2 F (36.8 C), temperature source Oral, resp. rate 15, height 5\' 8"  (1.727 m), weight 208 lb 5.4 oz (94.5 kg), SpO2 99.00%. Temp:  [97.3 F (36.3 C)-98.2 F (36.8 C)] 98.2 F (36.8 C) (11/21 1156) Pulse Rate:  [71-91] 77 (11/21 1156) Resp:  [12-25] 15 (11/21 1100) BP: (81-119)/(51-72) 95/64 mmHg (11/21 1156) SpO2:  [93 %-99 %] 99 % (11/21 1156) Weight:  [208 lb 5.4 oz (94.5 kg)] 208 lb 5.4 oz (94.5 kg) (11/21 0500)  Physical Exam: General: Alert and awake, oriented x3, not in any acute distress. HEENT: anicteric sclera, pupils reactive to light and accommodation, EOMI CVS regular rate, normal r,  no murmur rubs or gallops Chest: clear to auscultation bilaterally, no wheezing, rales or rhonchi Abdomen: soft nontender, nondistended, normal bowel sounds, Extremities: no  clubbing or edema noted bilaterally Skin: no rashes Lymph: no new lymphadenopathy Neuro: nonfocal  Lab Results:  Recent Labs  10/27/13 0625 10/28/13 0900  WBC 19.1* 16.6*  HGB  13.1 12.3*  HCT 36.5* 34.8*  PLT 146* 174    BMET  Recent Labs  10/27/13 1500 10/28/13 0615  NA 126* 130*  K 4.8 4.1  CL 95* 96  CO2 23 28  GLUCOSE 137* 122*  BUN 41* 38*  CREATININE 1.25 1.15  CALCIUM 7.5* 7.7*    Micro Results: Recent Results (from the past 240 hour(s))  CULTURE, BLOOD (ROUTINE X 2)     Status: None   Collection Time    10/24/13  3:57 PM      Result Value Range Status   Specimen Description BLOOD LEFT ANTECUBITAL   Final   Special Requests BOTTLES DRAWN AEROBIC AND ANAEROBIC 10CC   Final   Culture  Setup Time     Final   Value: 10/24/2013 22:44     Performed at [facility]   Culture     Final   Value: METHICILLIN RESISTANT STAPHYLOCOCCUS AUREUS     Note:  RIFAMPIN AND GENTAMICIN SHOULD NOT BE USED AS SINGLE DRUGS FOR TREATMENT OF STAPH INFECTIONS. This organism DOES NOT demonstrate inducible Clindamycin resistance in vitro. CRITICAL RESULT CALLED TO, READ BACK BY AND VERIFIED WITH: Gae Gallop RN      10/27/13 830AM BY INGRAM A      Note: Gram Stain Report Called to,Read Back By and Verified With: [place] [facility] ON 10/25/2013 AT 8:17P BY WILEJ     Performed at [facility]   Report Status 10/27/2013 FINAL   Final   Organism ID, Bacteria METHICILLIN RESISTANT STAPHYLOCOCCUS AUREUS   Final  CULTURE, BLOOD (ROUTINE X 2)     Status: None   Collection Time    10/24/13  4:07 PM      Result Value Range Status   Specimen Description BLOOD HAND LEFT   Final   Special Requests BOTTLES DRAWN AEROBIC ONLY [facility]   Final   Culture  Setup Time     Final   Value: 10/24/2013 22:43     Performed at [facility]   Culture     Final   Value: METHICILLIN RESISTANT STAPHYLOCOCCUS AUREUS     Note: RIFAMPIN AND GENTAMICIN SHOULD NOT BE USED AS SINGLE DRUGS FOR TREATMENT OF STAPH INFECTIONS. CRITICAL RESULT CALLED TO, READ BACK BY AND VERIFIED WITH: ANA A. BY INGRAM A 11/20 This organism DOES NOT demonstrate inducible Clindamycin resistance in      vitro.     Note: Gram Stain Report Called to,Read Back By and Verified With: SAMANTHA CLAXTON 10/26/13 0845 BY SMITHERSJ     Performed at [facility]   Report Status 10/28/2013 FINAL   Final   Organism ID, Bacteria METHICILLIN RESISTANT STAPHYLOCOCCUS AUREUS   Final  MRSA PCR SCREENING     Status: Abnormal   Collection Time    10/24/13  7:55 PM      Result Value Range Status   MRSA by PCR POSITIVE (*) NEGATIVE Final   Comment:            The GeneXpert MRSA Assay (FDA     approved for NASAL specimens     only), is one component of a     comprehensive MRSA colonization     surveillance program. It is not     intended to diagnose MRSA     infection nor to guide or     monitor treatment for      MRSA infections.     RESULT CALLED TO, READ BACK BY AND VERIFIED  WITH:     YORK,M RN [id number] 10/24/13 MITCHELL,L  CULTURE, BLOOD (ROUTINE X 2)     Status: None   Collection Time    10/26/13  1:35 PM      Result Value Range Status   Specimen Description BLOOD LEFT ANTECUBITAL   Final   Special Requests BOTTLES DRAWN AEROBIC AND ANAEROBIC 10CC   Final   Culture  Setup Time     Final   Value: 10/26/2013 20:14     Performed at [facility]   Culture     Final   Value:        BLOOD CULTURE RECEIVED NO GROWTH TO DATE CULTURE WILL BE HELD FOR 5 DAYS BEFORE ISSUING A FINAL NEGATIVE REPORT     Performed at [facility]   Report Status PENDING   Incomplete  CULTURE, BLOOD (ROUTINE X 2)     Status: None   Collection Time    10/26/13  1:45 PM      Result Value Range Status   Specimen Description BLOOD LEFT HAND   Final   Special Requests     Final   Value: BOTTLES DRAWN AEROBIC AND ANAEROBIC BLUE 10CC RED 7CC   Culture  Setup Time     Final   Value: 10/26/2013 20:14     Performed at [facility]   Culture     Final   Value:        BLOOD CULTURE RECEIVED NO GROWTH TO DATE CULTURE WILL BE HELD FOR 5 DAYS BEFORE ISSUING A FINAL NEGATIVE REPORT     Performed at [facility]   Report Status PENDING   Incomplete  CLOSTRIDIUM DIFFICILE BY PCR     Status: None   Collection Time    10/26/13  2:21 PM      Result Value Range Status   C difficile by pcr NEGATIVE  NEGATIVE Final  AFB CULTURE WITH SMEAR     Status: None   Collection Time    10/26/13  2:21 PM      Result Value Range Status   Specimen Description PLEURAL FLUID LEFT   Final   Special Requests 6.0ML   Final   ACID FAST SMEAR     Final   Value: NO ACID FAST BACILLI SEEN     Performed at [facility]   Culture     Final   Value: CULTURE WILL BE EXAMINED FOR 6 WEEKS BEFORE ISSUING A FINAL REPORT     Performed at [facility]   Report Status PENDING   Incomplete  BODY FLUID CULTURE      Status: None   Collection Time    10/26/13  2:21 PM      Result Value Range Status   Specimen Description PLEURAL FLUID LEFT   Final   Special Requests 6.0ML FLUID   Final   Gram Stain     Final   Value: WBC PRESENT, PREDOMINANTLY PMN     NO ORGANISMS SEEN     Performed at [facility]   Culture     Final   Value: NO GROWTH 2 DAYS     Performed at [facility]   Report Status PENDING   Incomplete  FUNGUS CULTURE W SMEAR     Status: None   Collection Time    10/26/13  2:21 PM      Result Value Range Status   Specimen Description PLEURAL FLUID LEFT   Final  Special Requests 6.0ML FLUID   Final   Fungal Smear     Final   Value: NO YEAST OR FUNGAL ELEMENTS SEEN     Performed at [facility]   Culture     Final   Value: CULTURE IN PROGRESS FOR FOUR WEEKS     Performed at [facility]   Report Status PENDING   Incomplete    Studies/Results: Dg Abd 1 View  10/27/2013   CLINICAL DATA:  Ileus versus small bowel obstruction.  EXAM: ABDOMEN - 1 VIEW  COMPARISON:  10/25/2013 CT scan  FINDINGS: No significant change in the colonic contrast medium. Sigmoid diverticulosis.  Small bowel loops are dilated up to 4.3 cm.  IMPRESSION: 1. Continued small bowel dilatation up to 4.3 cm, favoring ileus. Contrast medium stasis in the colon. 2. Sigmoid diverticulosis.   Electronically Signed   By: [name] M.D.   On: 10/27/2013 00:05      Assessment/Plan: [name] [name] is a 62 y.o. male with  Hep C, cirrhosis admitted with Pneumonia, pleural effusion and MRSA SAB   #1 SAB: he has no vegetations on TEE (Greatly appreciate Dr. Fabio Bering help here)  With negative TEE and no obvious other metastatic foci of infection, and with his blood cutures having (apparently so far being cleare  in 48 hours of starting IV antibiotics he would likely meet criteria for "uncomplicated SAB"  MRSA guidelines indicate he CAN be treated with a MINIMUM of 2 weeks of IV antibiotics  from date of first negative culture  However authors of those guidelines have pointed out prior literature that suggests that EVEN in [facility] cases pts frequently have relapse when rx for only 2 weeks  He has additional prognostic factors that might suggest he would be at risk for relapse, namely that onset was in the community and not a hospital acquired infection and secondly fact that he is cirrhotic and immunocompromised  Therefore AT MINIMUM he should receive IV vancomycin for goal trough of 15-20 thru December the 3rd, if not extending to a full 4 weeks with IV vancomycin thru December 17th  Finally I always worry about unidagnosed occult infection that would require longer therapy  He should have surveillance blood cultures 2 weeks after stopping abx  #2 Hep C: [facility]  I will ensure [facility]SFU in our clinic   Otherwise I will sign off for now  Please call for further questions.   LOS: 4 days   Acey Lav 10/28/2013, 4:01 PM

## 2013-10-29 DIAGNOSIS — D539 Nutritional anemia, unspecified: Secondary | ICD-10-CM

## 2013-10-29 LAB — CBC WITH DIFFERENTIAL/PLATELET
Eosinophils Absolute: 0.2 10*3/uL (ref 0.0–0.7)
Eosinophils Relative: 2 % (ref 0–5)
HCT: 31.9 % — ABNORMAL LOW (ref 39.0–52.0)
Hemoglobin: 11.3 g/dL — ABNORMAL LOW (ref 13.0–17.0)
Lymphs Abs: 1.3 10*3/uL (ref 0.7–4.0)
MCH: 36.6 pg — ABNORMAL HIGH (ref 26.0–34.0)
MCV: 103.2 fL — ABNORMAL HIGH (ref 78.0–100.0)
Monocytes Absolute: 1.2 10*3/uL — ABNORMAL HIGH (ref 0.1–1.0)
Monocytes Relative: 9 % (ref 3–12)
RBC: 3.09 MIL/uL — ABNORMAL LOW (ref 4.22–5.81)

## 2013-10-29 LAB — BASIC METABOLIC PANEL
CO2: 24 mEq/L (ref 19–32)
Calcium: 7.5 mg/dL — ABNORMAL LOW (ref 8.4–10.5)
GFR calc Af Amer: 69 mL/min — ABNORMAL LOW (ref >90)
Glucose, Bld: 105 mg/dL — ABNORMAL HIGH (ref 70–99)
Potassium: 4.3 mEq/L (ref 3.5–5.1)
Sodium: 130 mEq/L — ABNORMAL LOW (ref 135–145)

## 2013-10-29 LAB — BODY FLUID CULTURE

## 2013-10-29 NOTE — Progress Notes (Signed)
Internal Medicine Attending  Date: 10/29/2013  Patient name: [name] [name] Medical record number: [id number] Date of birth: Jul 04, 1951 Age: 62 y.o. Gender: male  I saw and evaluated the patient. I reviewed the resident's note by Dr. Johna Roles and I agree with the resident's findings and plans as documented in her progress note.  He is improving from a pulmonary standpoint with excellent oxygenation on 3 liters/min via nasal cannula.  He has also remained hemodynamically stable on the Vancomycin.  He does have a macular rash in his dependent areas of the hands, feet, posterior thighs, buttocks, and lower back.  Palms and soles are involved.  There are no lesions on the mucous membranes.  The rash is symmetric and some of the macules are now confluent.  There is no pruritis associated with these lesions and no evidence of necrosis.  He received both cefazolin and piperacillin prior to the appearance of this rash and I am leaning towards a beta-lactam associated drug rash as the cause.  I do not believe this is a Vancomycin associated rash, but we will continue to monitor him closely.  If there is progression now that the cefazolin and piperacillin have been discontinued then we may need to consider Vancomycin as a possible cause, which will complicate treatment of his systematic MRSA infection (? Lanazolid).  He is stable enough to go to a general medicine ward off of telemetry.

## 2013-10-29 NOTE — Progress Notes (Addendum)
Subjective:  Pt seen and examined in AM. No acute events overnight. Rash worse today and has spread to cover soles and abdomen. No ulcerations seen. His left sided chest tenderness has improved and is able move without difficulty. He denies fever, dyspnea, cough, chest pain,  tongue swelling,  joint pain, neuropathy, bleeding, extremity pain, or swelling. Pt reports mild epigastric pain present for 1 day. He has been having multiple normal BM's and normal urination.   Objective: Vital signs in last 24 hours: Filed Vitals:   10/28/13 2005 10/29/13 0014 10/29/13 0431 10/29/13 0820  BP: 121/71 94/62 96/62  100/62  Pulse: 85 82 80 74  Temp: 98 F (36.7 C) 98 F (36.7 C) 98.5 F (36.9 C) 98.4 F (36.9 C)  TempSrc: Oral Axillary Axillary Oral  Resp: 22 20 19 17   Height:      Weight:   209 lb 14.1 oz (95.2 kg)   SpO2: 91%  95% 96%   Weight change: 1 lb 8.7 oz (0.7 kg)  Intake/Output Summary (Last 24 hours) at 10/29/13 1259 Last data filed at 10/29/13 0800  Gross per 24 hour  Intake      0 ml  Output    200 ml  Net   -200 ml    Constitutional: He is oriented to person, place, and time. He appears well-developed and well-nourished. No distress.  Obese, no gynecomastia  HENT:  Head: Normocephalic and atraumatic.  Nose: Nose normal.  Mouth/Throat: Oropharynx is clear and moist. No oropharyngeal exudate. No ulcers. Eyes: EOM are normal.  Neck: Normal range of motion. Neck supple.  Cardiovascular: Normal rate, regular rhythm and normal heart sounds. Exam reveals no friction rub.  No murmur heard.  Pulmonary/Chest: He has no wheezes. He has no rales. Decreased breath sounds at bases. Better air movement. He exhibits point tenderness of lateral left chest wall.  Abdominal: Bowel sounds are normal. Soft, non-distended. There is mild LUQ tenderness. There is no guarding. A large umbilical hernia is present.  No caput medusa  No fluid wave Splenomegaly  Musculoskeletal: Normal  range of motion. He exhibits +1 b/l LE  edema   Neurological: He is alert and oriented to person, place, and time.  Skin: Skin is warm and dry. He is not diaphoretic. No erythema. Mild jaundice .  No spider angiomas or telangiectasias. Prominent petechial rash overlying extremities, both palms and soles, elbows, dorsum of feet, abdomen, buttocks, and groin area.   Psychiatric: He has a normal mood and affect. His behavior is normal. Judgment and thought content normal.  No asterixis    Lab Results: Basic Metabolic Panel:  Recent Labs Lab 10/27/13 0854 10/27/13 1500 10/28/13 0615 10/29/13 0537  NA 129* 126* 130* 130*  K 4.2 4.8 4.1 4.3  CL 95* 95* 96 98  CO2 26 23 28 24   GLUCOSE 139* 137* 122* 105*  BUN 41* 41* 38* 31*  CREATININE 1.32 1.25 1.15 1.24  CALCIUM 7.8* 7.5* 7.7* 7.5*  MG 2.4 2.4  --   --    Liver Function Tests:  Recent Labs Lab 10/25/13 0614 10/26/13 0448 10/26/13 1615  AST 30 31  --   ALT 22 19  --   ALKPHOS 103 114  --   BILITOT 3.1* 3.1*  --   PROT 6.5 6.3 6.2  ALBUMIN 2.2* 1.9*  --     Recent Labs Lab 10/24/13 0842  LIPASE 32    Recent Labs Lab 10/24/13 0842  AMMONIA 46  CBC:  Recent Labs Lab 10/28/13 0900 10/29/13 0537  WBC 16.6* 14.4*  NEUTROABS 13.9* 11.6*  HGB 12.3* 11.3*  HCT 34.8* 31.9*  MCV 101.5* 103.2*  PLT 174 176   Cardiac Enzymes:  Recent Labs Lab 10/24/13 0842 10/25/13 0614  TROPONINI <0.30 <0.30   BNP:  Recent Labs Lab 10/24/13 0842  PROBNP 136.0*   D-Dimer: No results found for this basename: DDIMER,  in the last 168 hours CBG: No results found for this basename: GLUCAP,  in the last 168 hours Hemoglobin A1C: No results found for this basename: HGBA1C,  in the last 168 hours Fasting Lipid Panel:  Recent Labs Lab 10/26/13 0448  10/27/13 1500  CHOL 58  < > 49  HDL 7*  --   --   LDLCALC 35  --   --   TRIG 79  --   --   CHOLHDL 8.3  --   --   < > = values in this interval not  displayed. Thyroid Function Tests: No results found for this basename: TSH, T4TOTAL, FREET4, T3FREE, THYROIDAB,  in the last 168 hours Coagulation:  Recent Labs Lab 10/24/13 0842 10/26/13 0448  LABPROT 17.7* 17.7*  INR 1.50* 1.50*   Anemia Panel: No results found for this basename: VITAMINB12, FOLATE, FERRITIN, TIBC, IRON, RETICCTPCT,  in the last 168 hours Urine Drug Screen: Drugs of Abuse     Component Value Date/Time   LABOPIA NONE DETECTED 10/24/2013 1240   COCAINSCRNUR NONE DETECTED 10/24/2013 1240   LABBENZ NONE DETECTED 10/24/2013 1240   AMPHETMU NONE DETECTED 10/24/2013 1240   THCU NONE DETECTED 10/24/2013 1240   LABBARB NONE DETECTED 10/24/2013 1240    Alcohol Level:  Recent Labs Lab 10/24/13 1721  ETH <11   Urinalysis:  Recent Labs Lab 10/24/13 1240 10/26/13 0829  COLORURINE AMBER* YELLOW  LABSPEC 1.024 1.023  PHURINE 5.0 5.0  GLUCOSEU NEGATIVE NEGATIVE  HGBUR SMALL* NEGATIVE  BILIRUBINUR SMALL* NEGATIVE  KETONESUR 15* NEGATIVE  PROTEINUR NEGATIVE NEGATIVE  UROBILINOGEN 0.2 0.2  NITRITE NEGATIVE NEGATIVE  LEUKOCYTESUR TRACE* NEGATIVE     Micro Results: Recent Results (from the past 240 hour(s))  CULTURE, BLOOD (ROUTINE X 2)     Status: None   Collection Time    10/24/13  3:57 PM      Result Value Range Status   Specimen Description BLOOD LEFT ANTECUBITAL   Final   Special Requests BOTTLES DRAWN AEROBIC AND ANAEROBIC 10CC   Final   Culture  Setup Time     Final   Value: 10/24/2013 22:44     Performed at [facility]   Culture     Final   Value: METHICILLIN RESISTANT STAPHYLOCOCCUS AUREUS     Note: RIFAMPIN AND GENTAMICIN SHOULD NOT BE USED AS SINGLE DRUGS FOR TREATMENT OF STAPH INFECTIONS. This organism DOES NOT demonstrate inducible Clindamycin resistance in vitro. CRITICAL RESULT CALLED TO, READ BACK BY AND VERIFIED WITH: Gae Gallop RN      10/27/13 830AM BY INGRAM A      Note: Gram Stain Report Called to,Read Back By and Verified  With: [place] [facility] ON 10/25/2013 AT 8:17P BY Serafina Mitchell     Performed at [facility]   Report Status 10/27/2013 FINAL   Final   Organism ID, Bacteria METHICILLIN RESISTANT STAPHYLOCOCCUS AUREUS   Final  CULTURE, BLOOD (ROUTINE X 2)     Status: None   Collection Time    10/24/13  4:07 PM      Result  Value Range Status   Specimen Description BLOOD HAND LEFT   Final   Special Requests BOTTLES DRAWN AEROBIC ONLY [facility]   Final   Culture  Setup Time     Final   Value: 10/24/2013 22:43     Performed at [facility]   Culture     Final   Value: METHICILLIN RESISTANT STAPHYLOCOCCUS AUREUS     Note: RIFAMPIN AND GENTAMICIN SHOULD NOT BE USED AS SINGLE DRUGS FOR TREATMENT OF STAPH INFECTIONS. CRITICAL RESULT CALLED TO, READ BACK BY AND VERIFIED WITH: ANA A. BY INGRAM A 11/20 This organism DOES NOT demonstrate inducible Clindamycin resistance in      vitro.     Note: Gram Stain Report Called to,Read Back By and Verified With: SAMANTHA CLAXTON 10/26/13 0845 BY SMITHERSJ     Performed at [facility]   Report Status 10/28/2013 FINAL   Final   Organism ID, Bacteria METHICILLIN RESISTANT STAPHYLOCOCCUS AUREUS   Final  MRSA PCR SCREENING     Status: Abnormal   Collection Time    10/24/13  7:55 PM      Result Value Range Status   MRSA by PCR POSITIVE (*) NEGATIVE Final   Comment:            The GeneXpert MRSA Assay (FDA     approved for NASAL specimens     only), is one component of a     comprehensive MRSA colonization     surveillance program. It is not     intended to diagnose MRSA     infection nor to guide or     monitor treatment for     MRSA infections.     RESULT CALLED TO, READ BACK BY AND VERIFIED WITH:     YORK,M RN [id number] 10/24/13 MITCHELL,L  CULTURE, BLOOD (ROUTINE X 2)     Status: None   Collection Time    10/26/13  1:35 PM      Result Value Range Status   Specimen Description BLOOD LEFT ANTECUBITAL   Final   Special Requests BOTTLES DRAWN AEROBIC AND ANAEROBIC  10CC   Final   Culture  Setup Time     Final   Value: 10/26/2013 20:14     Performed at [facility]   Culture     Final   Value:        BLOOD CULTURE RECEIVED NO GROWTH TO DATE CULTURE WILL BE HELD FOR 5 DAYS BEFORE ISSUING A FINAL NEGATIVE REPORT     Performed at [facility]   Report Status PENDING   Incomplete  CULTURE, BLOOD (ROUTINE X 2)     Status: None   Collection Time    10/26/13  1:45 PM      Result Value Range Status   Specimen Description BLOOD LEFT HAND   Final   Special Requests     Final   Value: BOTTLES DRAWN AEROBIC AND ANAEROBIC BLUE 10CC RED 7CC   Culture  Setup Time     Final   Value: 10/26/2013 20:14     Performed at [facility]   Culture     Final   Value:        BLOOD CULTURE RECEIVED NO GROWTH TO DATE CULTURE WILL BE HELD FOR 5 DAYS BEFORE ISSUING A FINAL NEGATIVE REPORT     Performed at [facility]   Report Status PENDING   Incomplete  CLOSTRIDIUM DIFFICILE BY PCR     Status:  None   Collection Time    10/26/13  2:21 PM      Result Value Range Status   C difficile by pcr NEGATIVE  NEGATIVE Final  AFB CULTURE WITH SMEAR     Status: None   Collection Time    10/26/13  2:21 PM      Result Value Range Status   Specimen Description PLEURAL FLUID LEFT   Final   Special Requests 6.0ML   Final   ACID FAST SMEAR     Final   Value: NO ACID FAST BACILLI SEEN     Performed at [facility]   Culture     Final   Value: CULTURE WILL BE EXAMINED FOR 6 WEEKS BEFORE ISSUING A FINAL REPORT     Performed at [facility]   Report Status PENDING   Incomplete  BODY FLUID CULTURE     Status: None   Collection Time    10/26/13  2:21 PM      Result Value Range Status   Specimen Description PLEURAL FLUID LEFT   Final   Special Requests 6.0ML FLUID   Final   Gram Stain     Final   Value: WBC PRESENT, PREDOMINANTLY PMN     NO ORGANISMS SEEN     Performed at [facility]   Culture     Final   Value: NO GROWTH 3  DAYS     Performed at [facility]   Report Status 10/29/2013 FINAL   Final  FUNGUS CULTURE W SMEAR     Status: None   Collection Time    10/26/13  2:21 PM      Result Value Range Status   Specimen Description PLEURAL FLUID LEFT   Final   Special Requests 6.0ML FLUID   Final   Fungal Smear     Final   Value: NO YEAST OR FUNGAL ELEMENTS SEEN     Performed at [facility]   Culture     Final   Value: CULTURE IN PROGRESS FOR FOUR WEEKS     Performed at [facility]   Report Status PENDING   Incomplete   Studies/Results: No results found. Medications: I have reviewed the patient's current medications. Scheduled Meds: . antiseptic oral rinse  15 mL Mouth Rinse q12n4p  . chlorhexidine  15 mL Mouth Rinse BID  . furosemide  40 mg Oral Daily  . hepatitis A virus (PF) vaccine  1 mL Intramuscular Once  . lactulose  20 g Oral TID  . mupirocin ointment  1 application Nasal BID  . sodium chloride  3 mL Intravenous Q12H  . spironolactone  100 mg Oral Daily  . vancomycin  750 mg Intravenous Q12H   Continuous Infusions: . sodium chloride     PRN Meds:.diphenhydrAMINE, morphine injection, ondansetron (ZOFRAN) IV Assessment/Plan: Principal Problem:   Sepsis Active Problems:   Hep C w/o coma, chronic   Liver cirrhosis   Acute respiratory failure with hypoxia   Ileus, acute   Pancreatitis, acute   Large Pleural effusion on left   AKI (acute kidney injury)   Chronic kidney disease (CKD), stage IV (severe)   Hyponatremia   Hypoalbuminemia   Hyperbilirubinemia   Essential hypertension, benign   Thrombocytopenia, chronic   Assessment: 62 year old man with past medical history of HTN, untreated chronic Hepatitis C infection, liver cirrhosis, and umbilical hernia who presented on 10/24/13 with left sided abdominal pain of 5 day duration.   Plan:  Sepsis secondary to MRSA bacteremia thought to be due to PNA complicated by acute hypoxic respiratory failure with  left exudative effusion requiring thoracentesis - improving  Pt presented with left sided upper quadrant abdominal and left sided chest pain with tachypnea (28-49) and leukocytosis (31K) with left shift (>20% bands) and neutrophilia (88%). Lipase within normal limits however on CT abdomen 11/17 there was presence of mild peripancreatic edema and large left pleural effusion concerning for pancreatitis. He reported no recent alcohol use and ethanol levels were negative. Lipid panel also normal. Pt requiring supplemental oxygen with ABG on 11/18 with normal pH and PaO2 63.7 on 0.50 % venturi mask most likely due to worsening left pleural effusion due to empyema .  Pt without fever and cough but dyspnea (with pain) and large layering pleural effusion and dense bibasilar atelectasis concerning for pneumonia and empyema. Repeat abd/CXR/CTabd on 11/18 with evidence of worsening left layering  pleural effusion and continued mild to moderate inflammation of the head of the pancreas with secondary involvement of duodenum.  Bedside thoracentesis was performed on 11/19 with 1L of yellow gold pleural fluid with CXR with greatly decreased size of left pleural effusion. Pt with blood culture x2  from 11/17 with MRSA). TEE was performed which was negative for infectious endocarditis.  -Thoracentesis on 11/19--> + Lights' criteria for exudative effusion (protein ratio 0.62, LDH  Ratio 2.85) WBC 3951 (usually empyema 5K to <100K) , pH 8.5  (usually empyema pH<7.2), pleural fluid with WBC's on gram stain, NGTD on culture   -Awaiting pleural cytology, RF, ADA, ANA, cholesterol, fungus (NGTD), AFB (NGTD) -Continue IV vancomycin Day  6   -Repeat blood cultures x 2 (11/19) - NGTD  -Per ID treat SAB for minimum of 2 weeks with goal trough 15-20 (Dec 3) after 1st negative blood culture with ID f/u for blood cultures 2 weeks after stopping antibiotics  -Per ID NO central line or PICC line placed until after 11/22 -Morphine PRN  pain -Continue to wean oxygen therapy to keep SpO2 > 92% -Monitor CBC - leukocytsois downtrending   Rash - worsening. Pt with development of confluent petechial(?) rash on palms, elbows, dorsum of feet, and groin area now spreading to abdomen, soles of feet and buttocks,  concerning for antibiotic reaction. Pt received cefazolin (2 doses on 11/19) and 1 dose on 11/20 and 5 days of vancomycin. Distribution of rash is not consistent with red man's syndrome due to vancomycin infusion reaction. Most likely due to cefazolin than vancomycin. - Appreciate ID input  -Continue to monitor - if continues to worsens with vancomycin, consider switching to Linezolid  - Administer Benadryl PRN   Renal Insufficiency - improved. Pt presented with Cr of 1.5 with unknown baseline. Possible etiology is pre-renal azotemia vs ATN (sepsis). Post-renal causes less likely considering no hydronpehrosis on CT abdomen. Pt without HTN, edema, or proteinuria on UA to suggest nephrotic syndrome or glomerulonephritis. UA on 11/19 was normal. -Avoid nephrotoxins (ACEi, ARB, NSAIDS, contrast)  -Continue to monitor BMP in setting of diuresis   Ileus -  Improving clinically. Etiology unknown, most likely due to severe illness vs mild pancreatitis.  -Repeat XR Abd 11/19 with ileus  -Advance to regular diet   -C diff negative -Limit narcotics   Compensated Liver Cirrhosis - stable. Most likely secondary to past alcohol abuse and untreated viral hepatitis C infection. There was evidence of splenomegaly with varies on CT abdomen and distension on exam without other signs of stigmata of chronic liver disease.  Pt's albumin is 2.5, bilirubin 2.4, INR 1.5, ascites that is medically controlled, and encephalopathy medically controlled with Child-Pugh Class C with life expectancy 1-3 years. CT abdomen with findings of cirrhosis with splenomegaly. Pt without ascites, fever, and encephalopathy, however with significant abdominal pain and  distension still concern for SBP. Pt with last reported alcoholic drink 1 month ago. Pt reports compliance with diuretic therapy at home. On exam there was distension however hard to appreciate ascites, thus paracentesis was not performed. Ethanol levels were negative and pt reports quitting heavy alcohol use. Pt with low prealbumin of 1.4,  indicating element of malnutrition. Also with elevated indirect and direct bilirubin. Records obtained from [facility] which revealed no esophageal varices from EGD from 10/2011 (records placed in pt's chart) -Continue PO home lasix 40 mg daily  -Monitor wt --> 210lb to 209 lb since admission -Monitor I &O's--> 2.8 L out since admission -Continue home PO lactulose 20 g TID   Hypotonic Hyponatremia - Stable. Pt asymptomatic. Baseline unknown. Most likely chronic due to liver cirrhosis. Hypotonic (276 serum osm) hypervolemic hyponatremia due to fluid overload. If euvolemic possibly due to SIADH from PNA.  -PO lasix 40 mg daily    -Monitor BMP   Macrocytic Anemia -  Pt with MCV 103. Possibly due to alcohol, B12/foalte deficiency vs medication induced.  -Obtain anemia panel    Thrombocytopenia - improved. Pt with splenomegaly and platelet count on admission of 126K (clumping present). Baseline platelet count unknown. No reports of recent bleeding.  -Continue to monitor CBC   Chronic Hepatitis C infection - stable.  Pt reports he was never treated. Complicated by liver cirrhosis. On admission pt with transaminitis (elevated AST 42). No evidence of rash, glomerulonephritis, or neuropathy to suggest cryoglobulinemia. Pt was HIV negative and UDS was negative.  -Limit tylenol use -Acute hepatitis panel --> HepS Ab neg, Hep A (NR), Hep C (reactive) -Hepatitis A vaccine -Pt should receive HCV treatment at [facility]  Diet: regular DVT PPx: SCDs  Code: Full if >50% chance of survival      Dispo: Disposition is deferred at this time, awaiting improvement of current  medical problems.  Anticipated discharge in approximately 5 day(s).   The patient does not have a current PCP (No Pcp Per Patient) and does need an [facility] hospital follow-up appointment after discharge.  The patient does not have transportation limitations that hinder transportation to clinic appointments.  .Services Needed at time of discharge: Y = Yes, Blank = No PT:   OT:   RN:   Equipment:   Other:     LOS: 5 days   Otis Brace, MD 10/29/2013, 12:59 PM

## 2013-10-30 DIAGNOSIS — A4102 Sepsis due to Methicillin resistant Staphylococcus aureus: Principal | ICD-10-CM

## 2013-10-30 LAB — CBC WITH DIFFERENTIAL/PLATELET
Eosinophils Absolute: 0.1 10*3/uL (ref 0.0–0.7)
Hemoglobin: 10.8 g/dL — ABNORMAL LOW (ref 13.0–17.0)
Lymphocytes Relative: 9 % — ABNORMAL LOW (ref 12–46)
Lymphs Abs: 1 10*3/uL (ref 0.7–4.0)
MCH: 37.6 pg — ABNORMAL HIGH (ref 26.0–34.0)
MCV: 104.2 fL — ABNORMAL HIGH (ref 78.0–100.0)
Monocytes Relative: 6 % (ref 3–12)
Neutrophils Relative %: 84 % — ABNORMAL HIGH (ref 43–77)
RBC: 2.87 MIL/uL — ABNORMAL LOW (ref 4.22–5.81)
WBC: 11.8 10*3/uL — ABNORMAL HIGH (ref 4.0–10.5)

## 2013-10-30 LAB — FOLATE: Folate: 7 ng/mL

## 2013-10-30 LAB — BASIC METABOLIC PANEL
CO2: 26 mEq/L (ref 19–32)
Calcium: 7.3 mg/dL — ABNORMAL LOW (ref 8.4–10.5)
Chloride: 98 mEq/L (ref 96–112)
GFR calc Af Amer: 72 mL/min — ABNORMAL LOW (ref >90)
GFR calc non Af Amer: 62 mL/min — ABNORMAL LOW (ref >90)
Glucose, Bld: 142 mg/dL — ABNORMAL HIGH (ref 70–99)
Potassium: 4.2 mEq/L (ref 3.5–5.1)
Sodium: 130 mEq/L — ABNORMAL LOW (ref 135–145)

## 2013-10-30 LAB — IRON AND TIBC: UIBC: <15 ug/dL — ABNORMAL LOW (ref 125–400)

## 2013-10-30 LAB — RETICULOCYTES
RBC.: 2.87 MIL/uL — ABNORMAL LOW (ref 4.22–5.81)
Retic Count, Absolute: 172.2 10*3/uL (ref 19.0–186.0)

## 2013-10-30 LAB — FERRITIN: Ferritin: 868 ng/mL — ABNORMAL HIGH (ref 22–322)

## 2013-10-30 MED ORDER — PANTOPRAZOLE SODIUM 20 MG PO TBEC
20.0000 mg | DELAYED_RELEASE_TABLET | Freq: Every day | ORAL | Status: DC
Start: 2013-10-31 — End: 2013-11-02
  Administered 2013-10-31 – 2013-11-01 (×2): 20 mg via ORAL
  Filled 2013-10-30 (×2): qty 1

## 2013-10-30 MED ORDER — PANTOPRAZOLE SODIUM 40 MG IV SOLR
40.0000 mg | Freq: Once | INTRAVENOUS | Status: AC
  Administered 2013-10-30: 40 mg via INTRAVENOUS
  Filled 2013-10-30: qty 40

## 2013-10-30 NOTE — Progress Notes (Signed)
New Admission Note:  Arrival Method:  Pt arrived via bed with nurse and nurse tech from unit 2C. Traveled with oxygen tank Mental Orientation: A&0X4 Telemetry: No orders Assessment: Completed Skin:  Generalized Petechiae on body IV: clean,dry and intact, saline locked Pain: 8 out of 10. Pain med given, refer to [facility] Tubes: N/A Safety Measures: Safety Fall Prevention Plan was given, discussed and signed. Admission: Completed [address] Orientation: Patient has been orientated to the room, unit and the staff. Family: No family present  Orders have been reviewed and implemented. Will continue to monitor the patient. Call light as been placed within reach and bed alarm has been activated.   Tempie Donning BSN, RN  Phone Number: [phone] [facility] [address] [place] Med/Surg-Renal Unit

## 2013-10-30 NOTE — Progress Notes (Signed)
Vancomycin per Rx  Infectious Disease: Vanc D#6/14 for sepsis and MRSA bacteremia. Pt on Cipro PTA since 11/12. LA wnl. procalcitonin nml. Wbc decr to 11.8; TEE done 11/21 - negative for IE. Now has confluent petechial(?) rash on palms, elbows, dorsum of feet, and groin area now spreading to abdomen, soles of feet and buttocks, concerning for antibiotic reaction - but not c/w Red Mans Synd - maybe due to previous Ancef? Awaiting pleural cytology, RF, ANA, fungal cx  Vanc 11/17>> (at min tx thru 12/3 - maybe 4 wk until 12/17) Zosyn 11/17>>11/19 Flagyl 11/17>>11/19 Cefazolin 11/19>>11/20  11/20 VT = 16  11/17 BCx: MRSA 11/19 BCx: NGTD 11/19 pleural fluid: NGTD  Nephrology: SCr 1.2- improved (unknown baseline). Na 130, Ca7.3, other lytes ok.  Plan: - cont Vancomycin 750mg  IV q12h - repeat trough soon (maybe Monday?) - f/u c/s, renal function, trough if SCr changes drastically, LOT, clinical progression

## 2013-10-30 NOTE — Progress Notes (Signed)
Subjective: Pt had no acute events overnight. Pt still complaining of some upper abdominal pain that was origionally relieved by eating (as pt was npo and had decreased po intake) but continues on this AM. Mostly epigastric that is neither relieved or exacerbated by food. Pt states it is a "burning fire" sensation. No associated diaphoresis, CP, SOB, n/v/d.   Objective: Vital signs in last 24 hours: Filed Vitals:   10/29/13 1300 10/29/13 2010 10/29/13 2345 10/30/13 0531  BP: 91/60 81/44 106/72 103/68  Pulse: 85 87 96 85  Temp: 99.3 F (37.4 C) 98.2 F (36.8 C) 99 F (37.2 C) 99.1 F (37.3 C)  TempSrc: Oral Oral Oral Oral  Resp: 21 24 17 19   Height:      Weight:   212 lb 4.9 oz (96.3 kg)   SpO2: 93% 94% 95% 95%   Weight change: 2 lb 6.8 oz (1.1 kg)  Intake/Output Summary (Last 24 hours) at 10/30/13 4696 Last data filed at 10/30/13 0600  Gross per 24 hour  Intake 751.83 ml  Output    175 ml  Net 576.83 ml    Constitutional: He is oriented to person, place, and time. He appears well-developed and well-nourished. No distress.  Obese, no gynecomastia  HENT:  Head: Normocephalic and atraumatic.  Nose: Nose normal.  Mouth/Throat: Oropharynx is clear and moist. No oropharyngeal exudate. No ulcers. Eyes: EOM are normal.  Neck: Normal range of motion. Neck supple.  Cardiovascular: Normal rate, regular rhythm and normal heart sounds. Exam reveals no friction rub.  No murmur heard.  Pulmonary/Chest: He has no wheezes. He has no rales. Decreased breath sounds at bases. Better air movement. He exhibits point tenderness of lateral left chest wall with well healing wound 2/2 thoracentesis.  Abdominal: Bowel sounds are normal. Soft, non-distended. There is mild LUQ tenderness. There is no guarding. A large umbilical hernia is present.  No caput medusa  No fluid wave Splenomegaly  Musculoskeletal: Normal range of motion. He exhibits +1 b/l LE  edema   Neurological: He is alert and  oriented to person, place, and time.  Skin: Skin is warm and dry. He is not diaphoretic. No erythema.  No spider angiomas or telangiectasias. Prominent petechial rash decreased in intensity and space overlying many dependent areas of body (ankles, buttock, none on back, and hands) including both palms and soles, elbows, dorsum of feet, abdomen, buttocks, and groin area.   Psychiatric: He has a normal mood and affect. His behavior is normal. Judgment and thought content normal.  No asterixis   Lab Results: Basic Metabolic Panel:  Recent Labs Lab 10/27/13 0854 10/27/13 1500  10/29/13 0537 10/30/13 0615  NA 129* 126*  < > 130* 130*  K 4.2 4.8  < > 4.3 4.2  CL 95* 95*  < > 98 98  CO2 26 23  < > 24 26  GLUCOSE 139* 137*  < > 105* 142*  BUN 41* 41*  < > 31* 27*  CREATININE 1.32 1.25  < > 1.24 1.20  CALCIUM 7.8* 7.5*  < > 7.5* 7.3*  MG 2.4 2.4  --   --   --   < > = values in this interval not displayed.  CBC:  Recent Labs Lab 10/29/13 0537 10/30/13 0615  WBC 14.4* 11.8*  NEUTROABS 11.6* 10.0*  HGB 11.3* 10.8*  HCT 31.9* 29.9*  MCV 103.2* 104.2*  PLT 176 158    Micro Results: Recent Results (from the past 240 hour(s))  CULTURE, BLOOD (ROUTINE  X 2)     Status: None   Collection Time    10/24/13  3:57 PM      Result Value Range Status   Specimen Description BLOOD LEFT ANTECUBITAL   Final   Special Requests BOTTLES DRAWN AEROBIC AND ANAEROBIC 10CC   Final   Culture  Setup Time     Final   Value: 10/24/2013 22:44     Performed at [facility]   Culture     Final   Value: METHICILLIN RESISTANT STAPHYLOCOCCUS AUREUS     Note: RIFAMPIN AND GENTAMICIN SHOULD NOT BE USED AS SINGLE DRUGS FOR TREATMENT OF STAPH INFECTIONS. This organism DOES NOT demonstrate inducible Clindamycin resistance in vitro. CRITICAL RESULT CALLED TO, READ BACK BY AND VERIFIED WITH: Gae Gallop RN      10/27/13 830AM BY INGRAM A      Note: Gram Stain Report Called to,Read Back By and Verified With:  [place] ON 10/25/2013 AT 8:17P BY WILEJ     Performed at [facility]   Report Status 10/27/2013 FINAL   Final   Organism ID, Bacteria METHICILLIN RESISTANT STAPHYLOCOCCUS AUREUS   Final  CULTURE, BLOOD (ROUTINE X 2)     Status: None   Collection Time    10/24/13  4:07 PM      Result Value Range Status   Specimen Description BLOOD HAND LEFT   Final   Special Requests BOTTLES DRAWN AEROBIC ONLY [facility]   Final   Culture  Setup Time     Final   Value: 10/24/2013 22:43     Performed at [facility]   Culture     Final   Value: METHICILLIN RESISTANT STAPHYLOCOCCUS AUREUS     Note: RIFAMPIN AND GENTAMICIN SHOULD NOT BE USED AS SINGLE DRUGS FOR TREATMENT OF STAPH INFECTIONS. CRITICAL RESULT CALLED TO, READ BACK BY AND VERIFIED WITH: ANA A. BY INGRAM A 11/20 This organism DOES NOT demonstrate inducible Clindamycin resistance in      vitro.     Note: Gram Stain Report Called to,Read Back By and Verified With: SAMANTHA CLAXTON 10/26/13 0845 BY SMITHERSJ     Performed at [facility]   Report Status 10/28/2013 FINAL   Final   Organism ID, Bacteria METHICILLIN RESISTANT STAPHYLOCOCCUS AUREUS   Final  MRSA PCR SCREENING     Status: Abnormal   Collection Time    10/24/13  7:55 PM      Result Value Range Status   MRSA by PCR POSITIVE (*) NEGATIVE Final   Comment:            The GeneXpert MRSA Assay (FDA     approved for NASAL specimens     only), is one component of a     comprehensive MRSA colonization     surveillance program. It is not     intended to diagnose MRSA     infection nor to guide or     monitor treatment for     MRSA infections.     RESULT CALLED TO, READ BACK BY AND VERIFIED WITH:     YORK,M RN [id number] 10/24/13 MITCHELL,L  CULTURE, BLOOD (ROUTINE X 2)     Status: None   Collection Time    10/26/13  1:35 PM      Result Value Range Status   Specimen Description BLOOD LEFT ANTECUBITAL   Final   Special Requests BOTTLES DRAWN AEROBIC AND ANAEROBIC 10CC    Final   Culture  Setup  Time     Final   Value: 10/26/2013 20:14     Performed at [facility]   Culture     Final   Value:        BLOOD CULTURE RECEIVED NO GROWTH TO DATE CULTURE WILL BE HELD FOR 5 DAYS BEFORE ISSUING A FINAL NEGATIVE REPORT     Performed at [facility]   Report Status PENDING   Incomplete  CULTURE, BLOOD (ROUTINE X 2)     Status: None   Collection Time    10/26/13  1:45 PM      Result Value Range Status   Specimen Description BLOOD LEFT HAND   Final   Special Requests     Final   Value: BOTTLES DRAWN AEROBIC AND ANAEROBIC BLUE 10CC RED 7CC   Culture  Setup Time     Final   Value: 10/26/2013 20:14     Performed at [facility]   Culture     Final   Value:        BLOOD CULTURE RECEIVED NO GROWTH TO DATE CULTURE WILL BE HELD FOR 5 DAYS BEFORE ISSUING A FINAL NEGATIVE REPORT     Performed at [facility]   Report Status PENDING   Incomplete  CLOSTRIDIUM DIFFICILE BY PCR     Status: None   Collection Time    10/26/13  2:21 PM      Result Value Range Status   C difficile by pcr NEGATIVE  NEGATIVE Final  AFB CULTURE WITH SMEAR     Status: None   Collection Time    10/26/13  2:21 PM      Result Value Range Status   Specimen Description PLEURAL FLUID LEFT   Final   Special Requests 6.0ML   Final   ACID FAST SMEAR     Final   Value: NO ACID FAST BACILLI SEEN     Performed at [facility]   Culture     Final   Value: CULTURE WILL BE EXAMINED FOR 6 WEEKS BEFORE ISSUING A FINAL REPORT     Performed at [facility]   Report Status PENDING   Incomplete  BODY FLUID CULTURE     Status: None   Collection Time    10/26/13  2:21 PM      Result Value Range Status   Specimen Description PLEURAL FLUID LEFT   Final   Special Requests 6.0ML FLUID   Final   Gram Stain     Final   Value: WBC PRESENT, PREDOMINANTLY PMN     NO ORGANISMS SEEN     Performed at [facility]   Culture     Final   Value: NO GROWTH 3 DAYS      Performed at [facility]   Report Status 10/29/2013 FINAL   Final  FUNGUS CULTURE W SMEAR     Status: None   Collection Time    10/26/13  2:21 PM      Result Value Range Status   Specimen Description PLEURAL FLUID LEFT   Final   Special Requests 6.0ML FLUID   Final   Fungal Smear     Final   Value: NO YEAST OR FUNGAL ELEMENTS SEEN     Performed at [facility]   Culture     Final   Value: CULTURE IN PROGRESS FOR FOUR WEEKS     Performed at [facility]   Report Status PENDING  Incomplete   Studies/Results: No results found. Medications: I have reviewed the patient's current medications. Scheduled Meds: . antiseptic oral rinse  15 mL Mouth Rinse q12n4p  . chlorhexidine  15 mL Mouth Rinse BID  . furosemide  40 mg Oral Daily  . hepatitis A virus (PF) vaccine  1 mL Intramuscular Once  . lactulose  20 g Oral TID  . mupirocin ointment  1 application Nasal BID  . [START ON 10/31/2013] pantoprazole  20 mg Oral Daily  . sodium chloride  3 mL Intravenous Q12H  . spironolactone  100 mg Oral Daily  . vancomycin  750 mg Intravenous Q12H   Continuous Infusions: . sodium chloride 10 mL/hr (10/29/13 2337)   PRN Meds:.diphenhydrAMINE, morphine injection, ondansetron (ZOFRAN) IV Assessment/Plan: Principal Problem:   Sepsis Active Problems:   Hep C w/o coma, chronic   Liver cirrhosis   Acute respiratory failure with hypoxia   Ileus, acute   Pancreatitis, acute   Large Pleural effusion on left   AKI (acute kidney injury)   Chronic kidney disease (CKD), stage IV (severe)   Hyponatremia   Hypoalbuminemia   Hyperbilirubinemia   Essential hypertension, benign   Thrombocytopenia, chronic   Assessment: 62 year old man with past medical history of HTN, untreated chronic Hepatitis C infection, liver cirrhosis, and umbilical hernia who presented on 10/24/13 with left sided abdominal pain of 5 day duration.   Plan:    Sepsis secondary to MRSA PNA complicated by  MRSA bacteremia and parapneumonic effusion- improving: Pt presented in sepsis (leukocytes, tachypnea, tachycardia) and unknown source CT abdomen 11/17 no clear intra-abdominal source but then developed respiratory failure and repeat imaging concern for increasing pleural effusion and PNA. Bedside thoracentesis was performed on 11/19 with 1L of exudative fluid with negative cultures. Pt with blood culture x2  from 11/17 with MRSA). TEE was performed which was negative for infectious endocarditis.  -Continue IV vancomycin Day  7/14 greatly appreciate ID recs  -will need PICC for continued Abx  -Morphine PRN pain -PPI for epigastric pain  Petechiae Rash:. Pt with development of confluent petechial(?) in dependent areas also including palms and soles this seems to correlate with the addition of Unasyn when G+ bacteremia was a concern. Pt had already been on Vanc for 4 days prior w/o any reaction. Pt pltlts stable in setting of liver cirrhosis and less likely cryoglobulins with HepC infxn. Drug induced vasculitis possibilty and concerning for antibiotic reaction. -Continue to monitor - if continues to worsens with vancomycin, consider switching to Linezolid  - Administer Benadryl PRN   Compensated Liver Cirrhosis 2/2 HepC with thrombocytopenia and hx of encephalopathy - stable. Most likely secondary to past alcohol abuse and untreated viral hepatitis C infection. Records obtained from [facility] which revealed no esophageal varices from EGD from 10/2011 (records placed in pt's chart) -Continue PO home lasix 40 mg daily  -Continue home PO lactulose 20 g TID   Macrocytic Anemia -  Pt with MCV 103. Possibly due to alcohol, B12/foalte deficiency vs medication induced.  -Obtain anemia panel  pending  Diet: regular DVT PPx: SCDs  Code: Full if >50% chance of survival (per the patient wishes)   Dispo: Disposition is deferred at this time, awaiting improvement of current medical problems.  Anticipated  discharge in approximately 5 day(s).   The patient does not have a current PCP (No Pcp Per Patient) and does need an [facility] hospital follow-up appointment after discharge.  The patient does not have transportation limitations that hinder transportation to  clinic appointments.  .Services Needed at time of discharge: Y = Yes, Blank = No PT:   OT:   RN:   Equipment:   Other:     LOS: 6 days   Christen Bame, MD 10/30/2013, 9:18 AM

## 2013-10-31 ENCOUNTER — Encounter (HOSPITAL_COMMUNITY): Payer: Self-pay | Admitting: Cardiovascular Disease

## 2013-10-31 LAB — BASIC METABOLIC PANEL
BUN: 26 mg/dL — ABNORMAL HIGH (ref 6–23)
CO2: 26 mEq/L (ref 19–32)
Calcium: 7.6 mg/dL — ABNORMAL LOW (ref 8.4–10.5)
Chloride: 101 mEq/L (ref 96–112)
Creatinine, Ser: 1.33 mg/dL (ref 0.50–1.35)
GFR calc Af Amer: 64 mL/min — ABNORMAL LOW (ref >90)
GFR calc non Af Amer: 55 mL/min — ABNORMAL LOW (ref >90)
Glucose, Bld: 90 mg/dL (ref 70–99)

## 2013-10-31 LAB — CBC WITH DIFFERENTIAL/PLATELET
Basophils Absolute: 0 10*3/uL (ref 0.0–0.1)
Basophils Relative: 0 % (ref 0–1)
Eosinophils Relative: 1 % (ref 0–5)
HCT: 30 % — ABNORMAL LOW (ref 39.0–52.0)
Lymphs Abs: 1.6 10*3/uL (ref 0.7–4.0)
MCH: 37.7 pg — ABNORMAL HIGH (ref 26.0–34.0)
MCHC: 35.7 g/dL (ref 30.0–36.0)
MCV: 105.6 fL — ABNORMAL HIGH (ref 78.0–100.0)
Monocytes Absolute: 0.7 10*3/uL (ref 0.1–1.0)
Platelets: 152 10*3/uL (ref 150–400)
RDW: 15.6 % — ABNORMAL HIGH (ref 11.5–15.5)
WBC: 10.8 10*3/uL — ABNORMAL HIGH (ref 4.0–10.5)

## 2013-10-31 MED ORDER — SODIUM CHLORIDE 0.9 % IV BOLUS (SEPSIS)
500.0000 mL | Freq: Once | INTRAVENOUS | Status: AC
  Administered 2013-10-31: 500 mL via INTRAVENOUS

## 2013-10-31 NOTE — Progress Notes (Signed)
Subjective:  Pt seen and examined in AM. No acute events overnight. Rash improved today and has disappeared on palms and soles, now just present over arms, legs, abdomen and groin area. No mouth ulcerations seen. His left sided chest tenderness has greatly improved and is able move without difficulty. He denies fever, dyspnea, cough, chest pain,  tongue swelling,  joint pain, neuropathy, bleeding, extremity pain, or swelling. Pt reports mild epigastric pain after eating too much but better now.  He has been having multiple normal BM's and normal urination.   Objective: Vital signs in last 24 hours: Filed Vitals:   10/30/13 2103 10/31/13 0530 10/31/13 0540 10/31/13 0801  BP: 99/67 89/56 84/52  90/64  Pulse: 98 83    Temp: 99.6 F (37.6 C) 99.1 F (37.3 C)    TempSrc: Oral Oral    Resp: 18 18    Height:      Weight: 97.705 kg (215 lb 6.4 oz)     SpO2: 93% 93%     Weight change: 1.405 kg (3 lb 1.6 oz)  Intake/Output Summary (Last 24 hours) at 10/31/13 0953 Last data filed at 10/31/13 0951  Gross per 24 hour  Intake   1570 ml  Output    677 ml  Net    893 ml    Constitutional: He is oriented to person, place, and time. He appears well-developed and well-nourished. No distress.  Obese, no gynecomastia  HENT:  Head: Normocephalic and atraumatic.  Nose: Nose normal.  Mouth/Throat: Oropharynx is clear and moist. No oropharyngeal exudate. No ulcers. Eyes: EOM are normal.  Neck: Normal range of motion. Neck supple.  Cardiovascular: Normal rate, regular rhythm and normal heart sounds. Exam reveals no friction rub.  No murmur heard.  Pulmonary/Chest: He has no wheezes. He has no rales. Decreased breath sounds at bases. Better air movement. .  Abdominal: Bowel sounds are normal. Soft, non-distended. There is mild LUQ tenderness. There is no guarding. A large umbilical hernia is present.  No caput medusa  No fluid wave Splenomegaly  Musculoskeletal: Normal range of motion.  He exhibits +1 b/l LE  edema   Neurological: He is alert and oriented to person, place, and time.  Skin: Skin is warm and dry. He is not diaphoretic. No erythema. Mild jaundice .  No spider angiomas or telangiectasias. Much improved petechial rash (no longer on palms and soles) overlying extremities, abdomen, buttocks, and groin area   Psychiatric: He has a normal mood and affect. His behavior is normal. Judgment and thought content normal.  No asterixis    Lab Results: Basic Metabolic Panel:  Recent Labs Lab 10/27/13 0854 10/27/13 1500  10/30/13 0615 10/31/13 0635  NA 129* 126*  < > 130* 131*  K 4.2 4.8  < > 4.2 4.3  CL 95* 95*  < > 98 101  CO2 26 23  < > 26 26  GLUCOSE 139* 137*  < > 142* 90  BUN 41* 41*  < > 27* 26*  CREATININE 1.32 1.25  < > 1.20 1.33  CALCIUM 7.8* 7.5*  < > 7.3* 7.6*  MG 2.4 2.4  --   --   --   < > = values in this interval not displayed. Liver Function Tests:  Recent Labs Lab 10/25/13 0614 10/26/13 0448 10/26/13 1615  AST 30 31  --   ALT 22 19  --   ALKPHOS 103 114  --   BILITOT 3.1* 3.1*  --  PROT 6.5 6.3 6.2  ALBUMIN 2.2* 1.9*  --    No results found for this basename: LIPASE, AMYLASE,  in the last 168 hours No results found for this basename: AMMONIA,  in the last 168 hours CBC:  Recent Labs Lab 10/30/13 0615 10/31/13 0635  WBC 11.8* 10.8*  NEUTROABS 10.0* 8.4*  HGB 10.8* 10.7*  HCT 29.9* 30.0*  MCV 104.2* 105.6*  PLT 158 152   Cardiac Enzymes:  Recent Labs Lab 10/25/13 0614  TROPONINI <0.30   BNP: No results found for this basename: PROBNP,  in the last 168 hours D-Dimer: No results found for this basename: DDIMER,  in the last 168 hours CBG: No results found for this basename: GLUCAP,  in the last 168 hours Hemoglobin A1C: No results found for this basename: HGBA1C,  in the last 168 hours Fasting Lipid Panel:  Recent Labs Lab 10/26/13 0448  10/27/13 1500  CHOL 58  < > 49  HDL 7*  --   --   LDLCALC 35  --   --    TRIG 79  --   --   CHOLHDL 8.3  --   --   < > = values in this interval not displayed. Thyroid Function Tests: No results found for this basename: TSH, T4TOTAL, FREET4, T3FREE, THYROIDAB,  in the last 168 hours Coagulation:  Recent Labs Lab 10/26/13 0448  LABPROT 17.7*  INR 1.50*   Anemia Panel:  Recent Labs Lab 10/30/13 0615  VITAMINB12 1456*  FOLATE 7.0  FERRITIN 868*  TIBC NOT CALC  IRON 90  RETICCTPCT 6.0*   Urine Drug Screen: Drugs of Abuse     Component Value Date/Time   LABOPIA NONE DETECTED 10/24/2013 1240   COCAINSCRNUR NONE DETECTED 10/24/2013 1240   LABBENZ NONE DETECTED 10/24/2013 1240   AMPHETMU NONE DETECTED 10/24/2013 1240   THCU NONE DETECTED 10/24/2013 1240   LABBARB NONE DETECTED 10/24/2013 1240    Alcohol Level:  Recent Labs Lab 10/24/13 1721  ETH <11   Urinalysis:  Recent Labs Lab 10/24/13 1240 10/26/13 0829  COLORURINE AMBER* YELLOW  LABSPEC 1.024 1.023  PHURINE 5.0 5.0  GLUCOSEU NEGATIVE NEGATIVE  HGBUR SMALL* NEGATIVE  BILIRUBINUR SMALL* NEGATIVE  KETONESUR 15* NEGATIVE  PROTEINUR NEGATIVE NEGATIVE  UROBILINOGEN 0.2 0.2  NITRITE NEGATIVE NEGATIVE  LEUKOCYTESUR TRACE* NEGATIVE     Micro Results: Recent Results (from the past 240 hour(s))  CULTURE, BLOOD (ROUTINE X 2)     Status: None   Collection Time    10/24/13  3:57 PM      Result Value Range Status   Specimen Description BLOOD LEFT ANTECUBITAL   Final   Special Requests BOTTLES DRAWN AEROBIC AND ANAEROBIC 10CC   Final   Culture  Setup Time     Final   Value: 10/24/2013 22:44     Performed at [facility]   Culture     Final   Value: METHICILLIN RESISTANT STAPHYLOCOCCUS AUREUS     Note: RIFAMPIN AND GENTAMICIN SHOULD NOT BE USED AS SINGLE DRUGS FOR TREATMENT OF STAPH INFECTIONS. This organism DOES NOT demonstrate inducible Clindamycin resistance in vitro. CRITICAL RESULT CALLED TO, READ BACK BY AND VERIFIED WITH: Gae Gallop RN      10/27/13 830AM BY  INGRAM A      Note: Gram Stain Report Called to,Read Back By and Verified With: [place] [facility] ON 10/25/2013 AT 8:17P BY Serafina Mitchell     Performed at [facility]   Report Status 10/27/2013 FINAL  Final   Organism ID, Bacteria METHICILLIN RESISTANT STAPHYLOCOCCUS AUREUS   Final  CULTURE, BLOOD (ROUTINE X 2)     Status: None   Collection Time    10/24/13  4:07 PM      Result Value Range Status   Specimen Description BLOOD HAND LEFT   Final   Special Requests BOTTLES DRAWN AEROBIC ONLY [facility]   Final   Culture  Setup Time     Final   Value: 10/24/2013 22:43     Performed at [facility]   Culture     Final   Value: METHICILLIN RESISTANT STAPHYLOCOCCUS AUREUS     Note: RIFAMPIN AND GENTAMICIN SHOULD NOT BE USED AS SINGLE DRUGS FOR TREATMENT OF STAPH INFECTIONS. CRITICAL RESULT CALLED TO, READ BACK BY AND VERIFIED WITH: ANA A. BY INGRAM A 11/20 This organism DOES NOT demonstrate inducible Clindamycin resistance in      vitro.     Note: Gram Stain Report Called to,Read Back By and Verified With: SAMANTHA CLAXTON 10/26/13 0845 BY SMITHERSJ     Performed at [facility]   Report Status 10/28/2013 FINAL   Final   Organism ID, Bacteria METHICILLIN RESISTANT STAPHYLOCOCCUS AUREUS   Final  MRSA PCR SCREENING     Status: Abnormal   Collection Time    10/24/13  7:55 PM      Result Value Range Status   MRSA by PCR POSITIVE (*) NEGATIVE Final   Comment:            The GeneXpert MRSA Assay (FDA     approved for NASAL specimens     only), is one component of a     comprehensive MRSA colonization     surveillance program. It is not     intended to diagnose MRSA     infection nor to guide or     monitor treatment for     MRSA infections.     RESULT CALLED TO, READ BACK BY AND VERIFIED WITH:     YORK,M RN [id number] 10/24/13 MITCHELL,L  CULTURE, BLOOD (ROUTINE X 2)     Status: None   Collection Time    10/26/13  1:35 PM      Result Value Range Status   Specimen Description BLOOD LEFT  ANTECUBITAL   Final   Special Requests BOTTLES DRAWN AEROBIC AND ANAEROBIC 10CC   Final   Culture  Setup Time     Final   Value: 10/26/2013 20:14     Performed at [facility]   Culture     Final   Value:        BLOOD CULTURE RECEIVED NO GROWTH TO DATE CULTURE WILL BE HELD FOR 5 DAYS BEFORE ISSUING A FINAL NEGATIVE REPORT     Performed at [facility]   Report Status PENDING   Incomplete  CULTURE, BLOOD (ROUTINE X 2)     Status: None   Collection Time    10/26/13  1:45 PM      Result Value Range Status   Specimen Description BLOOD LEFT HAND   Final   Special Requests     Final   Value: BOTTLES DRAWN AEROBIC AND ANAEROBIC BLUE 10CC RED 7CC   Culture  Setup Time     Final   Value: 10/26/2013 20:14     Performed at [facility]   Culture     Final   Value:        BLOOD CULTURE RECEIVED NO GROWTH  TO DATE CULTURE WILL BE HELD FOR 5 DAYS BEFORE ISSUING A FINAL NEGATIVE REPORT     Performed at [facility]   Report Status PENDING   Incomplete  CLOSTRIDIUM DIFFICILE BY PCR     Status: None   Collection Time    10/26/13  2:21 PM      Result Value Range Status   C difficile by pcr NEGATIVE  NEGATIVE Final  AFB CULTURE WITH SMEAR     Status: None   Collection Time    10/26/13  2:21 PM      Result Value Range Status   Specimen Description PLEURAL FLUID LEFT   Final   Special Requests 6.0ML   Final   ACID FAST SMEAR     Final   Value: NO ACID FAST BACILLI SEEN     Performed at [facility]   Culture     Final   Value: CULTURE WILL BE EXAMINED FOR 6 WEEKS BEFORE ISSUING A FINAL REPORT     Performed at [facility]   Report Status PENDING   Incomplete  BODY FLUID CULTURE     Status: None   Collection Time    10/26/13  2:21 PM      Result Value Range Status   Specimen Description PLEURAL FLUID LEFT   Final   Special Requests 6.0ML FLUID   Final   Gram Stain     Final   Value: WBC PRESENT, PREDOMINANTLY PMN     NO ORGANISMS SEEN      Performed at [facility]   Culture     Final   Value: NO GROWTH 3 DAYS     Performed at [facility]   Report Status 10/29/2013 FINAL   Final  FUNGUS CULTURE W SMEAR     Status: None   Collection Time    10/26/13  2:21 PM      Result Value Range Status   Specimen Description PLEURAL FLUID LEFT   Final   Special Requests 6.0ML FLUID   Final   Fungal Smear     Final   Value: NO YEAST OR FUNGAL ELEMENTS SEEN     Performed at [facility]   Culture     Final   Value: CULTURE IN PROGRESS FOR FOUR WEEKS     Performed at [facility]   Report Status PENDING   Incomplete   Studies/Results: No results found. Medications: I have reviewed the patient's current medications. Scheduled Meds: . antiseptic oral rinse  15 mL Mouth Rinse q12n4p  . chlorhexidine  15 mL Mouth Rinse BID  . furosemide  40 mg Oral Daily  . hepatitis A virus (PF) vaccine  1 mL Intramuscular Once  . lactulose  20 g Oral TID  . pantoprazole  20 mg Oral Daily  . sodium chloride  3 mL Intravenous Q12H  . spironolactone  100 mg Oral Daily  . vancomycin  750 mg Intravenous Q12H   Continuous Infusions: . sodium chloride 20 mL/hr at 10/31/13 0655   PRN Meds:.diphenhydrAMINE, morphine injection, ondansetron (ZOFRAN) IV Assessment/Plan: Principal Problem:   Sepsis Active Problems:   Hep C w/o coma, chronic   Liver cirrhosis   Acute respiratory failure with hypoxia   Ileus, acute   Pancreatitis, acute   Large Pleural effusion on left   AKI (acute kidney injury)   Chronic kidney disease (CKD), stage IV (severe)   Hyponatremia   Hypoalbuminemia   Hyperbilirubinemia   Essential  hypertension, benign   Thrombocytopenia, chronic   Assessment: 62 year old man with past medical history of HTN, untreated chronic Hepatitis C infection, liver cirrhosis, and umbilical hernia who presented on 10/24/13 with left sided abdominal pain of 5 day duration.   Plan:    Sepsis secondary to MRSA  bacteremia thought to be due to PNA complicated by acute hypoxic respiratory failure with left exudative effusion requiring thoracentesis - improving  Pt presented with left sided upper quadrant abdominal and left sided chest pain with tachypnea (28-49) and leukocytosis (31K) with left shift (>20% bands) and neutrophilia (88%). Lipase within normal limits however on CT abdomen 11/17 there was presence of mild peripancreatic edema and large left pleural effusion concerning for pancreatitis. He reported no recent alcohol use and ethanol levels were negative. Lipid panel also normal. Pt required supplemental oxygen with ABG on 11/18 with normal pH and PaO2 63.7 on 0.50 % venturi mask with repeat abd/CXR/CTabd on 11/18 with evidence of worsening left layering  pleural effusion and continued mild to moderate inflammation of the head of the pancreas with secondary involvement of duodenum.  Bedside thoracentesis was performed on 11/19 with 1L of yellow gold exudative pleural fluid with CXR revealing greatly decreased size of left pleural effusion. Pt with blood culture x2  from 11/17 with MRSA. TEE was performed which was negative for infectious endocarditis.  -Thoracentesis on 11/19--> + Lights' criteria for exudative effusion (protein ratio 0.62, LDH  Ratio 2.85) WBC 3951 (usually empyema 5K to <100K) , pH 8.5  (usually empyema pH<7.2), pleural fluid with WBC's on gram stain, NG on culture   -Awaiting pleural cytology, RF, ADA (27 H), ANA, cholesterol (WNL), fungus (NGTD), AFB (NGTD) -Continue IV vancomycin Day  8   -Repeat blood cultures x 2 (11/19) - NGTD  -Per ID treat SAB for minimum of 2 weeks with goal trough 15-20 (Dec 3) after 1st negative blood culture with ID f/u for blood cultures 2 weeks after stopping antibiotics  -Per ID NO central line or PICC line placed until after 11/22--> insert PICC line  -Morphine PRN pain -Oxygen therapy if needed to keep SpO2 > 92% -Monitor CBC - leukocytosis downtrending    -PT/OT evaluation for possible SNF placement as pt lives alone and will need vanc at least until Dec 3   Rash - improving. Pt with development of nonpruritic confluent petechial(?) rash ( 11/21) on palms, elbows, dorsum of feet, and groin area now spreading to abdomen, soles of feet and buttocks,  concerning for antibiotic reaction. Pt received zoysn on 11/17-11/9 ,  cefazolin 11/19-11/20 and 5 days of vancomycin before development of rash.. Distribution of rash is not consistent with red man's syndrome due to vancomycin infusion reaction. Most likely due to drug reaction to zosyn or  cefazolin than vancomycin. Pt reports he had rash to some type of antibiotic as a child.  -Continue to monitor rash  -Benadryl PRN   Renal Insufficiency - improved. Pt presented with Cr of 1.5 with unknown baseline. Possible etiology is pre-renal azotemia vs ATN (sepsis). Post-renal causes less likely considering no hydronpehrosis on CT abdomen. Pt without HTN, edema, or proteinuria on UA to suggest nephrotic syndrome or glomerulonephritis. UA on 11/19 was normal. -Avoid nephrotoxins (ACEi, ARB, NSAIDS, contrast)  -Continue to monitor BMP   Ileus -  Improved clinically. Etiology unknown, most likely due to severe illness vs mild pancreatitis. Abd  XR on11/19 with ileus. C diff was negative.   -Continue regular diet   -Limit narcotics  Compensated Liver Cirrhosis - stable. Most likely secondary to past alcohol abuse and untreated viral hepatitis C infection. There was evidence of splenomegaly with varies on CT abdomen and distension on exam without other signs of stigmata of chronic liver disease. Pt's albumin is 2.5, bilirubin 2.4, INR 1.5, ascites that is medically controlled, and encephalopathy medically controlled with Child-Pugh Class C with life expectancy 1-3 years. CT abdomen with findings of cirrhosis with splenomegaly. Pt without ascites, fever, and encephalopathy, however with significant abdominal pain and  distension still concern for SBP. Pt with last reported alcoholic drink 1 month ago. Pt reports compliance with diuretic therapy at home. On exam there was distension however hard to appreciate ascites, thus paracentesis was not performed. Ethanol levels were negative and pt reports quitting heavy alcohol use. Pt with low prealbumin of 1.4,  indicating element of malnutrition. Also with elevated indirect and direct bilirubin. Records obtained from [facility] which revealed no esophageal varices from EGD from 10/2011 (records placed in pt's chart) -Continue PO home lasix 40 mg daily  -Monitor wt --> 210lb to 215 lb(?) since admission -Monitor I &O's--> 3.452 L out since admission -Continue home PO lactulose 20 g TID   Hypotonic Hyponatremia - Stable. Pt asymptomatic. Baseline unknown. Most likely chronic due to liver cirrhosis. Hypotonic (276 serum osm) hypervolemic hyponatremia due to fluid overload. If euvolemic possibly due to SIADH from PNA.  -PO lasix 40 mg daily    -Monitor BMP   Macrocytic Anemia -  Stable. Pt with MCV 103. Possibly due to liver cirrhosis vs alcohol.  -Obtain anemia panel --> B12 high, Folate WNL, Iron WNl, TIBC WNL, UIBC low, high ferritin, high retic count  Thrombocytopenia - resolved. Pt with splenomegaly and platelet count on admission of 126K (clumping present). Baseline platelet count unknown. No reports of recent bleeding.  -Continue to monitor CBC   Chronic Hepatitis C infection - stable.  Pt reports he was never treated. Complicated by liver cirrhosis. On admission pt with transaminitis (elevated AST 42). No evidence of rash, glomerulonephritis, or neuropathy to suggest cryoglobulinemia. Pt was HIV negative and UDS was negative.  -Limit tylenol use -Acute hepatitis panel --> HepS Ab neg, Hep A (NR), Hep C (reactive) -Hepatitis A vaccine -Pt should receive HCV [facility] at [facility]  Diet: regular DVT PPx: SCDs  Code: Full if >50% chance of survival      Dispo:  Disposition is deferred at this time, awaiting improvement of current medical problems.  Anticipated discharge in approximately 5 day(s).   The patient does not have a current PCP (No Pcp Per Patient) and does need an [facility] hospital follow-up appointment after discharge.  The patient does not have transportation limitations that hinder transportation to clinic appointments.  .Services Needed at time of discharge: Y = Yes, Blank = No PT:   OT:   RN:   Equipment:   Other:     LOS: 7 days   Otis Brace, MD 10/31/2013, 9:53 AM

## 2013-10-31 NOTE — Progress Notes (Signed)
Patient requested sequential compression devices be removed from bilateral lower extremities.  Patient educated on risk and verbalized understanding.  SCDs removed.  Continue to assess.

## 2013-10-31 NOTE — Progress Notes (Signed)
Late Entry   Nurse tech began to check AM vitals when patients BP was 89/56 then taken manually 84/52. On call MD Mikey Bussing was notified and came to see the patient at bedside. New orders were placed. 500cc bolus was initiated and given per on call MD. Will continue to monitor the patient and keep his IV at [facility] with NS.   [employer] [phone]

## 2013-10-31 NOTE — Progress Notes (Signed)
  Date: 10/31/2013  Patient name: [name] [name]  Medical record number: [id number]  Date of birth: Oct 13, 1951   This patient has been seen and the plan of care was discussed with the house staff. Please see their note for complete details. I concur with their findings with the following additions/corrections:  Patient continues on Vancomycin, based on ID note, appears that it would be best to run until a stop date of Dec 17.  He will need access via PICC and appropriate discharge planning for IV Abx either at home or at a SNF.   Inez Catalina, MD 10/31/2013, 3:11 PM

## 2013-10-31 NOTE — Progress Notes (Signed)
Internal Medicine Teaching Service Night Float Progress Note  S: Called by nursing patient had BPs of 89/56 with repeat 84/52.  Patient denies any dizziness, HA, weakness.  Reports he recently walked to bathroom without any symptoms.  Per nursing he lost IV access last night but this was recently reestablished.  O: BP 84/52 P:83 Gen: Resting in Bed comfortably Cardiac: RRR Abd: Soft nontender, no fluid wave Ext: 2+ DP and PT pulses, 1+ LE edema b/l  A: Hypotension  P: - 500cc NS bolus then KVO fluids - Recheck BP after bolus  Carlynn Purl, DO 6:55 AM

## 2013-10-31 NOTE — Evaluation (Signed)
Physical Therapy Evaluation Patient Details Name: [name] [name] MRN: [id number] DOB: 05-24-1951 Today's Date: 10/31/2013 Time: 4098-1191 PT Time Calculation (min): 28 min  PT Assessment / Plan / Recommendation History of Present Illness   Pt is an independent 62 y.o. Male who was recently treated for UTI at the [place] in [place]. Adm to [facility] secondary to n/v, chronic loose stool. Pt has been on home diuretic therapy secondary to cirrhosis. Pt found to have elevated WBC and to be mildy hyponatremia with elevated Cr. Pt being treated for sepsis.   Clinical Impression  Pt adm secondary to above. Presents with minor deficits in mobility secondary to deficits listed below (see PT problem list). Pt to benefit from skilled PT to maximize functional mobility. Will plan to attempt ambulating with [facility] and to practice stairs next session prior to D/C home.       PT Assessment  Patient needs continued PT services    Follow Up Recommendations  No PT follow up    Does the patient have the potential to tolerate intense rehabilitation      Barriers to Discharge Decreased caregiver support pt lives alone; daughter brings meals and food     Equipment Recommendations  Cane    Recommendations for Other Services     Frequency Min 3X/week    Precautions / Restrictions Precautions Precautions: None Restrictions Weight Bearing Restrictions: No   Pertinent Vitals/Pain See VSS.      Mobility  Bed Mobility Bed Mobility: Supine to Sit;Sitting - Scoot to Edge of Bed;Sit to Supine Supine to Sit: 4: Min assist;HOB elevated;With rails Sitting - Scoot to Edge of Bed: 6: Modified independent (Device/Increase time) Transfers Transfers: Sit to Stand;Stand to Sit Sit to Stand: From bed;From toilet;5: Supervision Stand to Sit: To bed;To toilet;With upper extremity assist;5: Supervision Details for Transfer Assistance: supervision for safety and min cues; requires UE support to come to standing position from  lower surface (ie toilet)  Ambulation/Gait Ambulation/Gait Assistance: 5: Supervision Ambulation Distance (Feet): 150 Feet Assistive device: None Ambulation/Gait Assistance Details: pt demo good technique; fatigued easily and demo SOB; O2 desat to 87% recovered with cues for deep breathing; No LOB noted ; pt was reaching for external objects at times to brace himself; will attempt [facility] next session Gait Pattern: Within Functional Limits Gait velocity: decreased Stairs: No Wheelchair Mobility Wheelchair Mobility: No         PT Diagnosis: Difficulty walking  PT Problem List: Decreased mobility;Decreased balance;Decreased knowledge of use of DME PT Treatment Interventions: DME instruction;Gait training;Stair training;Therapeutic activities;Functional mobility training;Therapeutic exercise;Balance training;Patient/family education     PT Goals(Current goals can be found in the care plan section) Acute Rehab PT Goals Patient Stated Goal: to get back to walking  PT Goal Formulation: With patient Time For Goal Achievement: 11/07/13 Potential to Achieve Goals: Good  Visit Information  Last PT Received On: 10/31/13 Assistance Needed: +1       Prior Functioning  Home Living Family/patient expects to be discharged to:: Private residence Living Arrangements: Alone Available Help at Discharge: Family;Available PRN/intermittently Type of Home: Apartment Home Access: Stairs to enter Entrance Stairs-Number of Steps: 5 Entrance Stairs-Rails: Right Home Layout: One level Home Equipment: None Additional Comments: has tub shower Prior Function Level of Independence: Independent Comments: pt does not drive; daughter brings his groceries  Communication Communication: No difficulties Dominant Hand: Left    Cognition  Cognition Arousal/Alertness: Awake/alert Behavior During Therapy: WFL for tasks assessed/performed Overall Cognitive Status: Within Functional Limits for tasks assessed  Extremity/Trunk Assessment Upper Extremity Assessment Upper Extremity Assessment: Defer to OT evaluation Lower Extremity Assessment Lower Extremity Assessment: Overall WFL for tasks assessed Cervical / Trunk Assessment Cervical / Trunk Assessment: Normal   Balance Balance Balance Assessed: Yes Static Sitting Balance Static Sitting - Balance Support: Bilateral upper extremity supported;Feet supported Static Sitting - Level of Assistance: 7: Independent Static Standing Balance Static Standing - Balance Support: No upper extremity supported;During functional activity Static Standing - Level of Assistance: 5: Stand by assistance  End of Session PT - End of Session Equipment Utilized During Treatment: Gait belt Activity Tolerance: Patient tolerated treatment well Patient left: in bed;with call bell/phone within reach Nurse Communication: Mobility status  GP     [name], [name] [phone] 10/31/2013, 3:45 PM

## 2013-10-31 NOTE — Progress Notes (Signed)
   CARE MANAGEMENT NOTE 10/31/2013  Patient:  [name] [name]   Account Number:  [id number]  Date Initiated:  10/25/2013  Documentation initiated by:  MAYO,HENRIETTA  Subjective/Objective Assessment:   adm with dx of SIRS; lives alone, independent PTA     Action/Plan:   Anticipated DC Date:  11/01/2013   Anticipated DC Plan:  HOME W HOME HEALTH SERVICES      DC Planning Services  CM consult      [facility] Choice  HOME HEALTH   Choice offered to / List presented to:          [facility] arranged  HH-1 RN  IV Antibiotics  HH-6 SOCIAL WORKER      HH agency  [facility]   Status of service:   Medicare Important Message given?   (If response is "NO", the following Medicare IM given date fields will be blank) Date Medicare IM given:   Date Additional Medicare IM given:    Discharge Disposition:    Per UR Regulation:  Reviewed for med. necessity/level of care/duration of stay  If discussed at Long Length of Stay Meetings, dates discussed:    Comments:  10/31/2013   [address] RN, [place] [phone] CM referral:  HHRN, SW, IV antibiotics  Met with patient regarding home health IV antibiotics. He lives alone, daughter lives about 6 blocks away. Selected [facility] for home health services.  [facility]//Marie called with referral

## 2013-11-01 LAB — CBC WITH DIFFERENTIAL/PLATELET
Basophils Absolute: 0 10*3/uL (ref 0.0–0.1)
Basophils Relative: 0 % (ref 0–1)
Eosinophils Relative: 1 % (ref 0–5)
Lymphocytes Relative: 14 % (ref 12–46)
MCHC: 35.8 g/dL (ref 30.0–36.0)
MCV: 105 fL — ABNORMAL HIGH (ref 78.0–100.0)
Neutro Abs: 7.1 10*3/uL (ref 1.7–7.7)
Platelets: 182 10*3/uL (ref 150–400)
RDW: 16 % — ABNORMAL HIGH (ref 11.5–15.5)
WBC: 9.1 10*3/uL (ref 4.0–10.5)

## 2013-11-01 LAB — CULTURE, BLOOD (ROUTINE X 2): Culture: NO GROWTH

## 2013-11-01 LAB — CHOLESTEROL, BODY FLUID: Cholesterol, Fluid: 37 mg/dL

## 2013-11-01 LAB — BASIC METABOLIC PANEL
Calcium: 7.6 mg/dL — ABNORMAL LOW (ref 8.4–10.5)
Creatinine, Ser: 1.3 mg/dL (ref 0.50–1.35)
GFR calc Af Amer: 65 mL/min — ABNORMAL LOW (ref >90)

## 2013-11-01 LAB — MISCELLANEOUS TEST

## 2013-11-01 MED ORDER — VANCOMYCIN HCL IN DEXTROSE 750-5 MG/150ML-% IV SOLN: 750.0000 mg | Freq: Two times a day (BID) | INTRAVENOUS | Status: AC

## 2013-11-01 MED ORDER — SODIUM CHLORIDE 0.9 % IJ SOLN
10.0000 mL | INTRAMUSCULAR | Status: DC | PRN
  Administered 2013-11-01: 10 mL

## 2013-11-01 MED ORDER — HEPARIN SOD (PORK) LOCK FLUSH 100 UNIT/ML IV SOLN
250.0000 [IU] | INTRAVENOUS | Status: AC | PRN
  Administered 2013-11-01: 250 [IU]

## 2013-11-01 MED ORDER — POTASSIUM CHLORIDE CRYS ER 20 MEQ PO TBCR: 20.0000 meq | EXTENDED_RELEASE_TABLET | Freq: Every day | ORAL | Status: AC

## 2013-11-01 NOTE — Progress Notes (Signed)
Physical Therapy Treatment Patient Details Name: [name] [name] MRN: [id number] DOB: July 23, 1951 Today's Date: 11/01/2013 Time: 1308-6578 PT Time Calculation (min): 30 min  PT Assessment / Plan / Recommendation  History of Present Illness Pt is an independent 62 y.o. Male who was recently treated for UTI at the [place] in [place]. Adm to [facility] secondary to n/v, chronic loose stool. Pt has been on home diuretic therapy secondary to cirrhosis. Pt found to have elevated WBC and to be mildy hyponatremia with elevated Cr. Pt being treated for sepsis.     PT Comments   Pt able to increase ambulation distance however continues to fatigue quickly and needs intermittent standing rest break.  Pt improve gait with SPC therefore recommend SPC at d/c.  Will continue to follow.   Follow Up Recommendations  No PT follow up     Does the patient have the potential to tolerate intense rehabilitation     Barriers to Discharge        Equipment Recommendations  Cane    Recommendations for Other Services    Frequency Min 3X/week   Progress towards PT Goals Progress towards PT goals: Progressing toward goals  Plan Current plan remains appropriate    Precautions / Restrictions Precautions Precautions: None Restrictions Weight Bearing Restrictions: No   Pertinent Vitals/Pain No c/o pain    Mobility  Bed Mobility Bed Mobility: Not assessed Transfers Transfers: Sit to Stand;Stand to Sit Sit to Stand: From bed;From toilet;5: Supervision Stand to Sit: To bed;To toilet;With upper extremity assist;5: Supervision Details for Transfer Assistance: supervision for safety and min cues; requires UE support to come to standing position from lower surface (ie toilet)  Ambulation/Gait Ambulation/Gait Assistance: 5: Supervision Ambulation Distance (Feet): 200 Feet Assistive device: Straight cane Ambulation/Gait Assistance Details: Pt reports more stability with use of SPC.  Pt conitnues to fatigue quickly and needs  intermittent standing rest break.  Gait Pattern: Within Functional Limits Gait velocity: decreased Stairs: Yes Stairs Assistance: 4: Min assist Stairs Assistance Details (indicate cue type and reason): (A) to maintain balance with cues for step sequence Stair Management Technique: One rail Right;Forwards;With cane Number of Stairs: 3 Wheelchair Mobility Wheelchair Mobility: No    Exercises     PT Diagnosis:    PT Problem List:   PT Treatment Interventions:     PT Goals (current goals can now be found in the care plan section) Acute Rehab PT Goals Patient Stated Goal: to get back to walking  PT Goal Formulation: With patient Time For Goal Achievement: 11/07/13 Potential to Achieve Goals: Good  Visit Information  Last PT Received On: 11/01/13 Assistance Needed: +1 History of Present Illness: Pt is an independent 62 y.o. Male who was recently treated for UTI at the [place] in [place]. Adm to [facility] secondary to n/v, chronic loose stool. Pt has been on home diuretic therapy secondary to cirrhosis. Pt found to have elevated WBC and to be mildy hyponatremia with elevated Cr. Pt being treated for sepsis.      Subjective Data  Subjective: "I'm doing better.  I hope I can go home today." Patient Stated Goal: to get back to walking    Cognition  Cognition Arousal/Alertness: Awake/alert Behavior During Therapy: WFL for tasks assessed/performed Overall Cognitive Status: Within Functional Limits for tasks assessed    Balance  Balance Balance Assessed: Yes Static Sitting Balance Static Sitting - Balance Support: Bilateral upper extremity supported;Feet supported Static Sitting - Level of Assistance: 7: Independent Static Standing Balance Static Standing - Balance  Support: No upper extremity supported;During functional activity Static Standing - Level of Assistance: 5: Stand by assistance  End of Session PT - End of Session Equipment Utilized During Treatment: Gait belt Activity Tolerance:  Patient tolerated treatment well Patient left: in bed;with call bell/phone within reach (sitting EOB) Nurse Communication: Mobility status   GP     Maurianna Benard 11/01/2013, 10:54 AM  Jake Shark, PT DPT [phone]

## 2013-11-01 NOTE — Discharge Summary (Signed)
Name: [name] [name] MRN: [id number] DOB: 16-Sep-1951 62 y.o. PCP: No Pcp Per Patient  Date of Admission: 10/24/2013  8:16 AM Date of Discharge: 11/01/2013 Attending Physician: Dillard Cannon. Criselda Peaches, MD  Discharge Diagnosis: 1.  Principal Problem:   Sepsis Active Problems:   Hep C w/o coma, chronic   Liver cirrhosis   Acute respiratory failure with hypoxia   Ileus, acute   Pancreatitis, acute   Large Pleural effusion on left   AKI (acute kidney injury)   Chronic kidney disease (CKD), stage IV (severe)   Hyponatremia   Hypoalbuminemia   Hyperbilirubinemia   Essential hypertension, benign   Thrombocytopenia, chronic  Discharge Medications:   Medication List    STOP taking these medications       ciprofloxacin 500 MG tablet  Commonly known as:  CIPRO     ibuprofen 200 MG tablet  Commonly known as:  ADVIL,MOTRIN      TAKE these medications       furosemide 40 MG tablet  Commonly known as:  LASIX  Take 40 mg by mouth daily.     lactulose 10 GM/15ML solution  Commonly known as:  CHRONULAC  Take 20 g by mouth 3 (three) times daily.     potassium chloride SA 20 MEQ tablet  Commonly known as:  K-DUR,KLOR-CON  Take 1 tablet (20 mEq total) by mouth daily.     spironolactone 100 MG tablet  Commonly known as:  ALDACTONE  Take 100 mg by mouth daily.     Vancomycin 750 MG/150ML Soln  Commonly known as:  VANCOCIN  Inject 150 mLs (750 mg total) into the vein every 12 (twelve) hours.        Disposition and follow-up:   [name] [name] was discharged from [facility] in Good condition.  At the hospital follow up visit please address:  1. Resolution of pneumonia     Resolution of macrocytic anemia      Resolution of rash      Referral to GI, hepatitis clinic        2.  Labs / imaging needed at time of follow-up: CBC (H/H),  Hepatitis C viral load, blood cultures x2 (Dec 5), CXR in 4-6 weeks  3.  Pending labs/ test needing follow-up: pleural AFB,  fungus  Follow-up Appointments:     Follow-up Information   Follow up with Staci Righter, MD On 11/09/2013. (3:30 PM)    Specialty:  Infectious Diseases   Contact information:   [address] [place] [address] [phone]       Follow up with Otis Brace, MD On 11/16/2013. (10:15 AM)    Specialty:  Internal Medicine   Contact information:   [address] [place] [address] [phone]       Discharge Instructions: Discharge Orders   Future Appointments Provider Department Dept Phone   11/09/2013 3:30 PM Gardiner Barefoot, MD [facility] [phone]   11/16/2013 10:15 AM Otis Brace, MD [facility] [phone]   Future Orders Complete By Expires   Increase activity slowly  As directed       Consultations: PCCM, ID, GI  Procedures Performed:  Ct Abdomen Pelvis Wo Contrast  10/25/2013   CLINICAL DATA:  62 year old male with worsening abdominal pain and distention. Earlier abdominal radiographs for which pneumoperitoneum was difficult to exclude. Recently diagnosed cirrhosis and possible mild pancreatitis. Initial encounter.  EXAM: CT ABDOMEN AND PELVIS WITHOUT CONTRAST  TECHNIQUE: Multidetector CT imaging of the abdomen and pelvis was performed following the standard protocol without intravenous contrast.  COMPARISON:  Abdominal radiographs from 0811 hr the same day. CT Abdomen and Pelvis 10/24/2013.  FINDINGS: No additional contrast administered for this scan.  Interval increased left pleural effusion, moderate to large and with complete left lower lobe atelectasis. No pericardial effusion. No right pleural effusion. Continued confluent right lower lobe basal segment atelectasis versus consolidation with air bronchograms. This finding might have stimulated pneumoperitoneum on the earlier radiographs.  Gynecomastia.  Stable visualized osseous structures. Disc and endplate degeneration in  the lumbar spine.  No pneumoperitoneum.  Trace fluid in an otherwise fact containing left inguinal hernia is stable. No free fluid in the pelvis. Unremarkable bladder. Negative distal colon. Sigmoid diverticulosis but no active inflammation. Oral contrast has reached the proximal sigmoid colon. Negative left colon. Negative transverse colon. Negative right colon, cecum and appendix.  Featureless air and fluid filled small bowel loops with increased small bowel caliber since 10/24/2013, now up to 41 mm diameter. Still, no small bowel wall thickening. No abrupt small bowel transition point.  Nodular cirrhotic liver appears stable. Cholelithiasis. No pericholecystic inflammation. Stable spleen with suggestion of splint around all varices. Stable non contrast kidneys. No hydronephrosis or hydroureter.  The stomach now is decompressed. There is persistent inflammation in the lesser sac primarily about the head of the pancreas and duodenum (series 2, image 41). No associated fluid collection. No abdominal free fluid.  Umbilical hernia containing fat an a small volume of fluid is stable.  IMPRESSION: 1. Negative for pneumoperitoneum.  2. Appearance of bowel most compatible with ileus. Interval dilation of small bowel up to 41 mm diameter, but contrast and gas throughout the colon and no small bowel transition point.  3. Continued mild to moderate inflammation about the head of the pancreas with secondary involvement of the duodenum. No fluid collection or free fluid.  4. Large layering left pleural effusion has increased. Complete left lower lobe compressive atelectasis. Continued confluent right lung base atelectasis versus consolidation.  5. Cirrhosis. Cholelithiasis. Sigmoid diverticulosis. Small umbilical and left inguinal hernias containing fat and trace fluid.   Electronically Signed   By: Augusto Gamble M.D.   On: 10/25/2013 11:33   Ct Abdomen Pelvis Wo Contrast  10/24/2013   CLINICAL DATA:  Pain left abdomen.  EXAM:  CT ABDOMEN AND PELVIS WITHOUT CONTRAST  TECHNIQUE: Multidetector CT imaging of the abdomen and pelvis was performed following the standard protocol without intravenous contrast.  COMPARISON:  AP abdomen 10/24/2013.  FINDINGS: Liver is irregular suggesting cirrhosis. Splenomegaly. Prominent serpiginous structures are noted adjacent to the spleen suggesting varices. Mild peripancreatic edema noted. Mild pancreatitis cannot be excluded. Gallstones. The gallbladder is nondistended. No biliary distention. No pericholecystic fluid collection. Gallbladder wall thickness normal by CT.  Adrenals normal. No focal renal abnormality. No hydronephrosis. Bladder nondistended. Prostate slightly irregular contour appearing calcifications in prostate. No free pelvic fluid.  Left inguinal hernia. No bowel herniation. Left hydrocele. No significant adenopathy. Aorta normal caliber.  Large umbilical hernia with herniation of fat only. Edema noted within the herniated fat suggesting cellulitis. No bowel herniation is noted. There are slightly distended loops of small bowel throughout the abdomen. The colonic gas pattern is normal, there is no colonic distention. Sigmoid colonic diverticulosis noted. These findings suggest the presence of an adynamic ileus versus partial small bowel obstruction. Adynamic ileus can occur secondary to pancreatitis. No free air. No pneumatosis.  Bilateral pleural effusions. Bibasilar  atelectasis and/or pneumonia. Borderline cardiomegaly. Degenerative changes lumbar spine.  IMPRESSION: 1. Findings suggesting cirrhosis with splenomegaly and varices. 2. Gallstones.  No biliary distention . 3. Mild peripancreatic edema, pancreatitis cannot be excluded. Probable associated adynamic ileus as there is mild small bowel distention. Followup abdominal series to exclude developing small bowel obstruction suggested. 4. Umbilical hernia. Umbilical hernia is prominent with herniation of fat only. Edema noted in the  herniated fat suggesting cellulitis. 5. Left inguinal hernia with herniation of fat only. Associated left hydrocele. 6. Bilateral pleural effusions and dense bibasilar atelectasis and/or pneumonia.   Electronically Signed   By: Maisie Fus  Register   On: 10/24/2013 14:12   Dg Chest 1 View  10/24/2013   CLINICAL DATA:  Shortness of breath.  EXAM: CHEST - 1 VIEW  FINDINGS: The patient has taken a markedly shallow inspiration. This limits evaluation of the chest. There is mild prominence of the interstitial markings particularly within the left hemi thorax accentuated by technique. Underlying component of mild edema versus a mild interstitial infiltrate, infectious or inflammatory, cannot be excluded. Repeat evaluation with deeper inspiration is recommended if clinically possible. The cardiac silhouette is poorly visualized. The visualized osseous structures are unremarkable.  IMPRESSION: 1. Markedly shallow inspiration 2. Interstitial findings likely secondary to technique nor underlying component of a mild interstitial infiltrate cannot be totally excluded particularly if clinically warranted. 3. Repeat evaluation recommended   Electronically Signed   By: Salome Holmes M.D.   On: 10/24/2013 10:00   Dg Abd 1 View  10/27/2013   CLINICAL DATA:  Ileus versus small bowel obstruction.  EXAM: ABDOMEN - 1 VIEW  COMPARISON:  10/25/2013 CT scan  FINDINGS: No significant change in the colonic contrast medium. Sigmoid diverticulosis.  Small bowel loops are dilated up to 4.3 cm.  IMPRESSION: 1. Continued small bowel dilatation up to 4.3 cm, favoring ileus. Contrast medium stasis in the colon. 2. Sigmoid diverticulosis.   Electronically Signed   By: [name] M.D.   On: 10/27/2013 00:05   Dg Abd 1 View  10/24/2013   CLINICAL DATA:  Abdominal pain.  EXAM: ABDOMEN - 1 VIEW  COMPARISON:  None.  FINDINGS: Air is seen within distended loops of large and small bowel. There is a paucity of distal bowel gas. Mild S-shaped  scoliosis is appreciated within the lumbar spine. A smoothly marginated rounded area of increased density projects within the central aspect of the pelvic inlet. This may represent an internal device if clinically appropriate possibly external artifact.  IMPRESSION: Nonspecific nonobstructive bowel gas pattern. An early or partial small bowel obstruction versus an ileus cannot be excluded. Surveillance evaluation recommended. Smoothly marginated area of increased density at the level of pelvic inlet as described above.   Electronically Signed   By: Salome Holmes M.D.   On: 10/24/2013 09:58   Dg Chest Port 1 View  10/26/2013   CLINICAL DATA:  Status post left thoracentesis.  EXAM: PORTABLE CHEST - 1 VIEW  COMPARISON:  10/1913 chest radiograph CT 0938 hr  FINDINGS: There is a small left pleural effusion, significantly decreased in size compared to the prior study. No pneumothorax is identified. The lungs are hypoinflated, more so than on the prior study. There is slightly improved aeration at the left lung base. Persistent opacity in the left lung base and streaky opacity in the right lung base may represent atelectasis. There is persistent more confluent opacity in the medial right lung base.  IMPRESSION: 1. Greatly decreased size of left pleural  effusion following thoracentesis. No pneumothorax identified. 2. Hypoinflated lungs with bibasilar opacities, suggestive of atelectasis although infection is not excluded, particularly in the medial right lung base.   Electronically Signed   By: Sebastian Ache   On: 10/26/2013 14:49   Dg Chest Port 1 View  10/26/2013   CLINICAL DATA:  Pneumonia  EXAM: PORTABLE CHEST - 1 VIEW  COMPARISON:  10/25/2013  FINDINGS: Moderately large left pleural effusion unchanged. Left lower lobe consolidation unchanged.  Progression of right lower lobe airspace disease which may be pneumonia or atelectasis. No significant effusion on the right. Negative for heart failure.  IMPRESSION:  Moderate left effusion and left lower lobe consolidation unchanged. This suspicious for pneumonia and possibly empyema. Chest CT with contrast suggested for further evaluation.  Progression of right lower lobe atelectasis/ infiltrate.   Electronically Signed   By: Marlan Palau M.D.   On: 10/26/2013 10:24   Dg Chest Port 1 View  10/25/2013   CLINICAL DATA:  Upper abdominal pain, shortness of breath  EXAM: PORTABLE CHEST - 1 VIEW  COMPARISON:  10/24/2013  FINDINGS: There is a moderate layering left pleural effusion. There is bibasilar airspace disease which may reflect atelectasis versus developing pneumonia, more focal at the right lung base. There is bilateral interstitial thickening and prominence of the central pulmonary vasculature. There is no pneumothorax. Stable heart size. Unremarkable osseous structures.  IMPRESSION: Moderate layering left pleural effusion. Bibasilar airspace disease which may reflect atelectasis versus developing pneumonia, more focal at the right lung base.   Electronically Signed   By: Elige Ko   On: 10/25/2013 08:31   Dg Abd Portable 2v  10/25/2013   CLINICAL DATA:  62 year old male with abdominal pain and distension. Fever and white count of unclear origin. Initial encounter. History of ventral abdominal hernia.  EXAM: PORTABLE ABDOMEN - 2 VIEW  COMPARISON:  CT Abdomen and Pelvis 10/24/2013 and earlier.  FINDINGS: Supine and left-side-down lateral decubitus views of the abdomen.  Widespread bowel gas, including distally in the rectum. Oral contrast has reached the colon as seen on the earlier CT. Continued gas-filled dilated small bowel loops in the lower abdomen, measuring up to 41 mm.  Unusual shaped gas and contrast collection in the left upper quadrant (arrow on the left-side-down decubitus view), but without gas layering under the right ribs. On the supine view, there is increased conspicuity of the walls of a small bowel loop located in the right upper quadrant, but  this might be related to contrast coating of the loop.  Stable visualized osseous structures.  IMPRESSION: 1. Difficult to fully exclude the possibility of pneumoperitoneum. If clinical suspicion warrants, recommend repeat CT abdomen pelvis (non contrast).  2. Bowel gas pattern most suggestive of ileus. Retained oral contrast in the colon.  Study discussed by telephone with EMILY MULLEN on 10/25/2013 at 08:55 .   Electronically Signed   By: Augusto Gamble M.D.   On: 10/25/2013 09:01    2D Echo: 10/28/13  Study Conclusions  - Left ventricle: Systolic function was normal. The estimated ejection fraction was in the range of 60% to 65%. Wall motion was normal; there were no regional wall motion abnormalities. - Mitral valve: Mild regurgitation. - Left atrium: No evidence of thrombus in the atrial cavity or appendage. - Right atrium: No evidence of thrombus in the atrial cavity or appendage. Impressions:  - No evidence of endocarditis. Vegetation is absent.   Cardiac Cath: none  Admission HPI:   [name] [name] is  a 62 year old man with past medical history of HTN, untreated chronic Hepatitis C infection, compensated liver cirrhosis, and umbilical hernia who presents with left sided abdominal pain of 5 day duration. Pt reports that last Monday (1 week ago) he began having poor appetite and decreased PO intake. Two days later on Wed he began having shaking chills and fevers. He then went to the [place] in [place] and was found to have a UTI. He received IV fluids for dehydration and ciprofloxacin for UTI which he was prescribed to take 500 mg BID at home. He reports no urinary symptoms at that time or currently. Pt reports that the physician pressed on his abdomen a lot, especially his left side. Since then he has been having severe sharp left sided abdominal pain with radiation to his left flank and left rib cage that is associated with dyspnea. Pain is unrelieved if he leans forward but improves if he stands  up. He no longer reports shaking chills or fever. He states he has been having intermittent nausea without vomiting for the past several days. He is on lactulose at home (for past history of hepatic encephalopathy) so he has loose stools at baseline without evidence of blood. He denies confusion, cough, rhinorrhea, sore throat, joint pain, bleeding, or rash. He has had no recent sick contacts . He has never had similar pain before and denies ever requiring abdominal tap in the past. He reports being compliant with diuretic therapy (40 mg lasix & spironolactone 100 mg daily) for cirrhosis. His mother died at age 50 from colon cancer. He states he has had colonoscopy in the past which he reports was normal. Also has had EGD in the past that was normal. He reports his last alcoholic drink was 1 month ago and that he no longer uses drugs or smokes cigarrettes.     Hospital Course by problem list: Principal Problem:   Sepsis Active Problems:   Hep C w/o coma, chronic   Liver cirrhosis   Acute respiratory failure with hypoxia   Ileus, acute   Pancreatitis, acute   Large Pleural effusion on left   AKI (acute kidney injury)   Chronic kidney disease (CKD), stage IV (severe)   Hyponatremia   Hypoalbuminemia   Hyperbilirubinemia   Essential hypertension, benign   Thrombocytopenia, chronic   Sepsis secondary to  MRSA bacteremia thought to be due to MRSA Pneumonia complicated by parapneumonic effusion and acute hypoxic respiratory failure -  Pt presented with left sided upper quadrant abdominal and left sided chest pain with tachypnea (28-49) and leukocytosis (31K) with left shift (>20% bands), neutrophilia (88%), and toxic granulation. Pt was started on IV vancomycin (11/17-11/25) and zosyn (11/17- 11/19) initially for broad-spectrum coverage. CT abdomen 11/17 revealed bilateral pleural effusions.  Pt required supplemental oxygen with ABG on 11/18 with normal pH and PaO2 63.7 on 0.50 % (15L/min) venturi  mask. Repeat CXR and CT abd on 11/18 indicated moderate layering left pleural effusion. PCCM was consulted who was unable to detect sufficient pleural fluid to warrant thoracentesis at that time. CXR on 11/19 revealed unchanged large left pleural effusion and progression of right lobe atelectasis/infiltrate. Patient required oxygen supplementation (2-6L/min via [place]; 15L/min via venturi mask) during hospitalization which improved after bedside US guided thoracentesis was performed on 11/19 by PCCM, who was then able to detect sufficient fluid to warrant thoracenteses with 1L of yellow gold pleural fluid drawn. CXR after procedure revealed greatly decreased size of left pleural effusion. Pleural effusion met Lights'  criteria for exudative effusion (protein ratio 0.62, LDH Ratio 2.85). Fluid analysis also revealed WBC 3951, pH 8.5 , WBC's on gram stain with no presence of growth on culture, RF (+), elevated ADA (27), ANA (-), cholesterol (WNL), with pending fungus (NGTD) and AFB (NGTD). Cytology revealed abdundant acute inflammation with no malignant cells. Pt with blood culture x2 from 11/17 with MRSA with repeat cultures on 11/19 with no growth. TEE was performed (pt without esophageal varices) which was negative for infectious endocarditis. Per ID due to negative TEE and no other obvious metastatic foci of infection (possibly PNA) with cleared blood cultures in 48 hours of starting IV antibiotics, bacteremia could be classified as uncomplicated. However, if etiology of MRSA bacteremia thought to be secondary to MRSA pneumonia complicated with parapneumonic effusion, as no other source of infection was found, then could be classified as complicated. Pt received IV vancomycin for 9 days during hospitalization with PICC line placement on 11/25 for at least 2 weeks (11/09/13) with goal trough 15-20, preferably to 11/23/13. Pt to follow-up with ID clinic for blood cultures 2 weeks after antibiotic stop date. He remained  afebrile during hospitalization with resolution of leukocytosis. His pleuritic left sided chest pain resolved during hospitalization. He no longer required oxygen supplementation and was able to maintain normal oxygen saturation with ambulation. No PT services were recommended at discharge. Home health services was set up for administration of IV anitiibotics via PICC line at his home until 11/23/13.   Ileus -  Pt presented with LUQ abdominal  pain and abdominal distension without peritoneal signs and normal passage of gas and stool. Abd XR on 11/17 revealed possible ileus with repeat imaging on 11/18 and 11/19 with bowel pattern (small bowel dilation) consistent with ileus.  Lipase was within normal limits however on CT abdomen 11/17 there was presence of mild peripancreatic edema and repeat CT abd on 11/18 indicated continued mild to moderate inflammation of the head of the pancreas with secondary involvement of the duodenum. Lactic acid levels were normal. He reported no recent alcohol use and ethanol levels were negative. Lipid panel was also normal. Etiology of ileus unknown, most likely due to severe illness vs mild pancreatitis. Pt received metronidazole from 11/18-11/9 for possible c. Diff colitis later discontinued after C diff toxin assay was negative.  Pt was placed on bowel rest with advancement of diet as tolerated. Narcotics were limited during hospitalization. Pt improved clinically with resolution of abdominal pain and distension. Colonoscopy records obtained from [facility] from 2012 were normal.   Rash - Pt with development of nonpruritic confluent petechial rash on palms, elbows, dorsum of feet, and groin area that began on 11/21 with spread to extremities, abdomen, soles of feet, and buttocks and then began to disappear on 11/24. There was no presence of peeling of the skin or blistering of mucous membranes to suggest TEN or Steven's Johnson syndrome. Pt received zoysn on 11/17-11/9 ,  metronidazole on 11/18-19,  cefazolin 11/19-11/20 and 5 days of vancomycin before development of rash. Distribution of rash was not consistent with red man's syndrome due to vancomycin infusion reaction and he did not have dyspnea, wheezing  CP, or throat swelling Etiology most likely due to drug reaction to zosyn or cefazolin than vancomycin or metronidazole. Pt reported he had a rash to some type of antibiotic as a child but did not remember which one. He may well have drug hypersensitivity reaction to penicillins or cephalosporins and these were consequently listed as allergies in  his chart. Pt received benadryl as needed for ithching.   Compensated Liver Cirrhosis - Pt with liver cirrhosis most likely secondary to past alcohol abuse and untreated viral hepatitis C infection. There was evidence of cirrhosis and  splenomegaly with varies on CT abdomen without other signs of stigmata of chronic liver disease. He was recently started on lactulose for encephalopathy and is on chronic diuretic therapy which he reported being compliant with. Pt with Child-Pugh Class C  (albumin 2.5, bilirubin 2.4, INR 1.5, ascites that is medically controlled, and encephalopathy medically controlled) with life expectancy 1-3 years. Pt did not have severe abdominal pain, ascites, fever, or encephalopathy (ammonia levels WNL). SBP was initial concern due to abdominal pain and sepsis however on physical exam there was no appreciable ascites to warrant diagnostic paracentesis. Source of infection was found to be pulmonary source and thus cefotaxime was not administered for SBP treatment.  Pt with last reported alcoholic drink 1 month ago and quit heavy use many years ago. Ethanol levels were negative. Records obtained from [facility] revealed no esophageal varices from EGD from 10/2011.  Pt was continued on home lasix 40 mg daily with no weight change during hospitalization  (discharge wt 210) and net 2.7L fluid output. Pt was  continued on home lactulose 20 g TID during hospitalization with no reported encephalopathy.   Renal Insufficiency - Pt presented with Cr of 1.5 with unknown baseline that peaked to 1.76 during hospitalization and improved to 1.30 on day of discharge. Pt without oliguria, HTN, edema, or proteinuria to suggest nephrotic syndrome or glomerulonephritis. CT imaging revealed normal appearing kidneys without hydronephrosis or hydroureter. UA on 11/19 was normal. Nephrotoxins (ACEi, ARB, NSAIDS, contrast) were held during hospitalization.    Hypotonic Hyponatremia - Pt with sodium on admission of 130 and hypotonicity (276) with unknown baseline most likely chronic due to liver cirrhosis. Pt was continued on home lasix 40 mg daily. Sodium levels remained stable during hospitalization and improved to 134 on day of discharge.   Macrocytic Anemia - Pt with MCV 103 most likely due to liver cirrhosis. Anemia panel was obtained on 10/30/13  which revealed high B12, Folate WNL, Iron WNl, TIBC WNL, UIBC low, high ferritin, retic WNL most likely due to liver cirrhosis. No active bleeding was reported or observed during hospitalization and H/H remained stable.     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 On admission pt had mild transaminitis (elevated AST 42) that later improved to normal. Pt developed rash during hospitalizaton however no glomerulonephritis or neuropathy to suggest cryoglobulinemia. Rash thought to be due to drug hypersensitivity to PCN or cephalopsporin. Pt tested HIV negative. His UDS was also negative. Acute hepatitis panel revealed HepS Ab neg, Hep A (NR), Hep C (R). Patient declined hepatitis A vaccination despite discussion of benefits of vaccination. Pt to receive outpatient Hep C viral load measurement and referral for treatment.  Thrombocytopenia -  Pt with platelet count on admission of 126K with unknown  baseline. Most likely due to liver cirrhosis in setting of splenomegaly. Platelet count normalized during hospitalization with platelet count of 182K on discharge. No reports of of active bleeding during hospitalization.   Coagulopathy - Pt with elevated INR of 1.50 on admission that remained stable during hospitalization. There were no signs of active bleeding during hospitalization.   Hypoalbuminemia - Pt with albumin of 2.5 on admission with normal total protein most likely  due to liver cirrhosis. Pt with low prealbumin of 1.4, indicating element of protein calorie malnutrition as well. Patient was continued on adequate diet once able to tolerate PO intake.   Hyperbilirubinemia - Pt with mild jaundice and elevated total bilirubin (indirect and direct) of 2.7  on admission most likely due to liver cirrhosis.   DVT Prophylaxis - Patient received bilateral leg SCDs during hospitalization.     Discharge Vitals:   BP 84/54  Pulse 86  Temp(Src) 98 F (36.7 C) (Oral)  Resp 18  Ht 5\' 8"  (1.727 m)  Wt 95.5 kg (210 lb 8.6 oz)  BMI 32.02 kg/m2  SpO2 96%  Discharge Labs:  Results for orders placed during the hospital encounter of 10/24/13 (from the past 24 hour(s))  BASIC METABOLIC PANEL     Status: Abnormal   Collection Time    11/01/13  6:24 AM      Result Value Range   Sodium 134 (*) 135 - 145 mEq/L   Potassium 4.8  3.5 - 5.1 mEq/L   Chloride 103  96 - 112 mEq/L   CO2 27  19 - 32 mEq/L   Glucose, Bld 116 (*) 70 - 99 mg/dL   BUN 20  6 - 23 mg/dL   Creatinine, Ser 6.57  0.50 - 1.35 mg/dL   Calcium 7.6 (*) 8.4 - 10.5 mg/dL   GFR calc non Af Amer 56 (*) >90 mL/min   GFR calc Af Amer 65 (*) >90 mL/min  CBC WITH DIFFERENTIAL     Status: Abnormal   Collection Time    11/01/13  6:24 AM      Result Value Range   WBC 9.1  4.0 - 10.5 K/uL   RBC 3.03 (*) 4.22 - 5.81 MIL/uL   Hemoglobin 11.4 (*) 13.0 - 17.0 g/dL   HCT 84.6 (*) 96.2 - 95.2 %   MCV 105.0 (*) 78.0 - 100.0 fL   MCH 37.6 (*)  26.0 - 34.0 pg   MCHC 35.8  30.0 - 36.0 g/dL   RDW 84.1 (*) 32.4 - 40.1 %   Platelets 182  150 - 400 K/uL   Neutrophils Relative % 78 (*) 43 - 77 %   Neutro Abs 7.1  1.7 - 7.7 K/uL   Lymphocytes Relative 14  12 - 46 %   Lymphs Abs 1.3  0.7 - 4.0 K/uL   Monocytes Relative 7  3 - 12 %   Monocytes Absolute 0.6  0.1 - 1.0 K/uL   Eosinophils Relative 1  0 - 5 %   Eosinophils Absolute 0.1  0.0 - 0.7 K/uL   Basophils Relative 0  0 - 1 %   Basophils Absolute 0.0  0.0 - 0.1 K/uL    Signed: Otis Brace, MD 11/01/2013, 9:30 PM   Time Spent on Discharge: 50  minutes Services Ordered on Discharge: Home Health RN Equipment Ordered on Discharge: cane

## 2013-11-01 NOTE — Progress Notes (Signed)
  Date: 11/01/2013  Patient name: [name] [name]  Medical record number: [id number]  Date of birth: 03/31/1951   This patient has been seen and the plan of care was discussed with the house staff. Please see their note for complete details. I concur with their findings with the following additions/corrections:  Vancomycin therapy to continue until Dec 17 for staph bacteremia and MRSA pneumonia.  PICC line today.  Home care to address antibiotics at home for the rest of his course of therapy.  He may be able to return home today if all home health needs are addressed.   Inez Catalina, MD 11/01/2013, 11:47 AM

## 2013-11-01 NOTE — Progress Notes (Signed)
Subjective:  Pt seen and examined in AM. No acute events overnight. Pt reports he feels good. Rash much better. Epigastric pain is improved. No fever, chills, cough, dyspnea, chest pain, nausea or vomiting,  He has normal BM and urination. He was able to ambulate yesterday without difficulty.    Objective: Vital signs in last 24 hours: Filed Vitals:   10/31/13 0945 10/31/13 1746 10/31/13 2135 11/01/13 0610  BP: 90/61 101/67 103/70 87/62  Pulse: 85 82 89 76  Temp: 98 F (36.7 C) 98 F (36.7 C) 98.1 F (36.7 C) 99.2 F (37.3 C)  TempSrc: Oral Oral Oral Oral  Resp: 18 16 20 20   Height:   5\' 8"  (1.727 m)   Weight:   95.5 kg (210 lb 8.6 oz)   SpO2: 93% 90% 90% 92%   Weight change: -2.205 kg (-4 lb 13.8 oz)  Intake/Output Summary (Last 24 hours) at 11/01/13 0748 Last data filed at 11/01/13 0150  Gross per 24 hour  Intake   1083 ml  Output    100 ml  Net    983 ml    Constitutional: He is oriented to person, place, and time. He appears well-developed and well-nourished. No distress.  Obese, no gynecomastia  HENT:  Head: Normocephalic and atraumatic.  Nose: Nose normal.  Mouth/Throat: Oropharynx is clear and moist. No oropharyngeal exudate. No ulcers. Eyes: EOM are normal.  Neck: Normal range of motion. Neck supple.  Cardiovascular: Normal rate, regular rhythm and normal heart sounds. Exam reveals no friction rub.  No murmur heard.  Pulmonary/Chest: He has no wheezes. He has no rales. Decreased breath sounds at bases. Good  air movement. Abdominal: Bowel sounds are normal. Soft, non-distended. There is mild LUQ tenderness. There is no guarding. A large umbilical hernia is present.  No caput medusa  No fluid wave Splenomegaly  Musculoskeletal: Normal range of motion. He exhibits +1 b/l LE  edema   Neurological: He is alert and oriented to person, place, and time.  Skin: Skin is warm and dry. He is not diaphoretic. No erythema. Mild jaundice .  No spider angiomas  or telangiectasias. Much improved petechial rash (no longer on palms and soles), less prominent, mainly on LE extremities, abdomen, buttocks, and groin area   Psychiatric: He has a normal mood and affect. His behavior is normal. Judgment and thought content normal.  No asterixis    Lab Results: Basic Metabolic Panel:  Recent Labs Lab 10/27/13 0854 10/27/13 1500  10/30/13 0615 10/31/13 0635  NA 129* 126*  < > 130* 131*  K 4.2 4.8  < > 4.2 4.3  CL 95* 95*  < > 98 101  CO2 26 23  < > 26 26  GLUCOSE 139* 137*  < > 142* 90  BUN 41* 41*  < > 27* 26*  CREATININE 1.32 1.25  < > 1.20 1.33  CALCIUM 7.8* 7.5*  < > 7.3* 7.6*  MG 2.4 2.4  --   --   --   < > = values in this interval not displayed. Liver Function Tests:  Recent Labs Lab 10/26/13 0448 10/26/13 1615  AST 31  --   ALT 19  --   ALKPHOS 114  --   BILITOT 3.1*  --   PROT 6.3 6.2  ALBUMIN 1.9*  --    No results found for this basename: LIPASE, AMYLASE,  in the last 168 hours No results found for this basename: AMMONIA,  in the  last 168 hours CBC:  Recent Labs Lab 10/31/13 0635 11/01/13 0624  WBC 10.8* 9.1  NEUTROABS 8.4* 7.1  HGB 10.7* 11.4*  HCT 30.0* 31.8*  MCV 105.6* 105.0*  PLT 152 182   Cardiac Enzymes: No results found for this basename: CKTOTAL, CKMB, CKMBINDEX, TROPONINI,  in the last 168 hours BNP: No results found for this basename: PROBNP,  in the last 168 hours D-Dimer: No results found for this basename: DDIMER,  in the last 168 hours CBG: No results found for this basename: GLUCAP,  in the last 168 hours Hemoglobin A1C: No results found for this basename: HGBA1C,  in the last 168 hours Fasting Lipid Panel:  Recent Labs Lab 10/26/13 0448  10/27/13 1500  CHOL 58  < > 49  HDL 7*  --   --   LDLCALC 35  --   --   TRIG 79  --   --   CHOLHDL 8.3  --   --   < > = values in this interval not displayed. Thyroid Function Tests: No results found for this basename: TSH, T4TOTAL, FREET4, T3FREE,  THYROIDAB,  in the last 168 hours Coagulation:  Recent Labs Lab 10/26/13 0448  LABPROT 17.7*  INR 1.50*   Anemia Panel:  Recent Labs Lab 10/30/13 0615  VITAMINB12 1456*  FOLATE 7.0  FERRITIN 868*  TIBC NOT CALC  IRON 90  RETICCTPCT 6.0*   Urine Drug Screen: Drugs of Abuse     Component Value Date/Time   LABOPIA NONE DETECTED 10/24/2013 1240   COCAINSCRNUR NONE DETECTED 10/24/2013 1240   LABBENZ NONE DETECTED 10/24/2013 1240   AMPHETMU NONE DETECTED 10/24/2013 1240   THCU NONE DETECTED 10/24/2013 1240   LABBARB NONE DETECTED 10/24/2013 1240    Alcohol Level: No results found for this basename: ETH,  in the last 168 hours Urinalysis:  Recent Labs Lab 10/26/13 0829  COLORURINE YELLOW  LABSPEC 1.023  PHURINE 5.0  GLUCOSEU NEGATIVE  HGBUR NEGATIVE  BILIRUBINUR NEGATIVE  KETONESUR NEGATIVE  PROTEINUR NEGATIVE  UROBILINOGEN 0.2  NITRITE NEGATIVE  LEUKOCYTESUR NEGATIVE     Micro Results: Recent Results (from the past 240 hour(s))  CULTURE, BLOOD (ROUTINE X 2)     Status: None   Collection Time    10/24/13  3:57 PM      Result Value Range Status   Specimen Description BLOOD LEFT ANTECUBITAL   Final   Special Requests BOTTLES DRAWN AEROBIC AND ANAEROBIC 10CC   Final   Culture  Setup Time     Final   Value: 10/24/2013 22:44     Performed at [facility]   Culture     Final   Value: METHICILLIN RESISTANT STAPHYLOCOCCUS AUREUS     Note: RIFAMPIN AND GENTAMICIN SHOULD NOT BE USED AS SINGLE DRUGS FOR TREATMENT OF STAPH INFECTIONS. This organism DOES NOT demonstrate inducible Clindamycin resistance in vitro. CRITICAL RESULT CALLED TO, READ BACK BY AND VERIFIED WITH: Gae Gallop RN      10/27/13 830AM BY INGRAM A      Note: Gram Stain Report Called to,Read Back By and Verified With: [facility] ON 10/25/2013 AT 8:17P BY Serafina Mitchell     Performed at [facility]   Report Status 10/27/2013 FINAL   Final   Organism ID, Bacteria METHICILLIN RESISTANT  STAPHYLOCOCCUS AUREUS   Final  CULTURE, BLOOD (ROUTINE X 2)     Status: None   Collection Time    10/24/13  4:07 PM  Result Value Range Status   Specimen Description BLOOD HAND LEFT   Final   Special Requests BOTTLES DRAWN AEROBIC ONLY [facility] [address]   Final   Culture  Setup Time     Final   Value: 10/24/2013 22:43     Performed at [facility]   Culture     Final   Value: METHICILLIN RESISTANT STAPHYLOCOCCUS AUREUS     Note: RIFAMPIN AND GENTAMICIN SHOULD NOT BE USED AS SINGLE DRUGS FOR TREATMENT OF STAPH INFECTIONS. CRITICAL RESULT CALLED TO, READ BACK BY AND VERIFIED WITH: ANA A. BY INGRAM A 11/20 This organism DOES NOT demonstrate inducible Clindamycin resistance in      vitro.     Note: Gram Stain Report Called to,Read Back By and Verified With: SAMANTHA CLAXTON 10/26/13 0845 BY SMITHERSJ     Performed at [facility]   Report Status 10/28/2013 FINAL   Final   Organism ID, Bacteria METHICILLIN RESISTANT STAPHYLOCOCCUS AUREUS   Final  MRSA PCR SCREENING     Status: Abnormal   Collection Time    10/24/13  7:55 PM      Result Value Range Status   MRSA by PCR POSITIVE (*) NEGATIVE Final   Comment:            The GeneXpert MRSA Assay (FDA     approved for NASAL specimens     only), is one component of a     comprehensive MRSA colonization     surveillance program. It is not     intended to diagnose MRSA     infection nor to guide or     monitor treatment for     MRSA infections.     RESULT CALLED TO, READ BACK BY AND VERIFIED WITH:     YORK,M RN [id number] 10/24/13 MITCHELL,L  CULTURE, BLOOD (ROUTINE X 2)     Status: None   Collection Time    10/26/13  1:35 PM      Result Value Range Status   Specimen Description BLOOD LEFT ANTECUBITAL   Final   Special Requests BOTTLES DRAWN AEROBIC AND ANAEROBIC 10CC   Final   Culture  Setup Time     Final   Value: 10/26/2013 20:14     Performed at [facility]   Culture     Final   Value:        BLOOD CULTURE RECEIVED NO  GROWTH TO DATE CULTURE WILL BE HELD FOR 5 DAYS BEFORE ISSUING A FINAL NEGATIVE REPORT     Performed at [facility]   Report Status PENDING   Incomplete  CULTURE, BLOOD (ROUTINE X 2)     Status: None   Collection Time    10/26/13  1:45 PM      Result Value Range Status   Specimen Description BLOOD LEFT HAND   Final   Special Requests     Final   Value: BOTTLES DRAWN AEROBIC AND ANAEROBIC BLUE 10CC RED 7CC   Culture  Setup Time     Final   Value: 10/26/2013 20:14     Performed at [facility]   Culture     Final   Value:        BLOOD CULTURE RECEIVED NO GROWTH TO DATE CULTURE WILL BE HELD FOR 5 DAYS BEFORE ISSUING A FINAL NEGATIVE REPORT     Performed at [facility]   Report Status PENDING   Incomplete  CLOSTRIDIUM DIFFICILE BY PCR  Status: None   Collection Time    10/26/13  2:21 PM      Result Value Range Status   C difficile by pcr NEGATIVE  NEGATIVE Final  AFB CULTURE WITH SMEAR     Status: None   Collection Time    10/26/13  2:21 PM      Result Value Range Status   Specimen Description PLEURAL FLUID LEFT   Final   Special Requests 6.0ML   Final   ACID FAST SMEAR     Final   Value: NO ACID FAST BACILLI SEEN     Performed at [facility]   Culture     Final   Value: CULTURE WILL BE EXAMINED FOR 6 WEEKS BEFORE ISSUING A FINAL REPORT     Performed at [facility]   Report Status PENDING   Incomplete  BODY FLUID CULTURE     Status: None   Collection Time    10/26/13  2:21 PM      Result Value Range Status   Specimen Description PLEURAL FLUID LEFT   Final   Special Requests 6.0ML FLUID   Final   Gram Stain     Final   Value: WBC PRESENT, PREDOMINANTLY PMN     NO ORGANISMS SEEN     Performed at [facility]   Culture     Final   Value: NO GROWTH 3 DAYS     Performed at [facility]   Report Status 10/29/2013 FINAL   Final  FUNGUS CULTURE W SMEAR     Status: None   Collection Time    10/26/13  2:21 PM       Result Value Range Status   Specimen Description PLEURAL FLUID LEFT   Final   Special Requests 6.0ML FLUID   Final   Fungal Smear     Final   Value: NO YEAST OR FUNGAL ELEMENTS SEEN     Performed at [facility]   Culture     Final   Value: CULTURE IN PROGRESS FOR FOUR WEEKS     Performed at [facility]   Report Status PENDING   Incomplete   Studies/Results: No results found. Medications: I have reviewed the patient's current medications. Scheduled Meds: . antiseptic oral rinse  15 mL Mouth Rinse q12n4p  . chlorhexidine  15 mL Mouth Rinse BID  . furosemide  40 mg Oral Daily  . hepatitis A virus (PF) vaccine  1 mL Intramuscular Once  . lactulose  20 g Oral TID  . pantoprazole  20 mg Oral Daily  . sodium chloride  3 mL Intravenous Q12H  . spironolactone  100 mg Oral Daily  . vancomycin  750 mg Intravenous Q12H   Continuous Infusions: . sodium chloride 20 mL/hr at 10/31/13 0655   PRN Meds:.diphenhydrAMINE, morphine injection, ondansetron (ZOFRAN) IV Assessment/Plan: Principal Problem:   Sepsis Active Problems:   Hep C w/o coma, chronic   Liver cirrhosis   Acute respiratory failure with hypoxia   Ileus, acute   Pancreatitis, acute   Large Pleural effusion on left   AKI (acute kidney injury)   Chronic kidney disease (CKD), stage IV (severe)   Hyponatremia   Hypoalbuminemia   Hyperbilirubinemia   Essential hypertension, benign   Thrombocytopenia, chronic   Assessment: 62 year old man with past medical history of HTN, untreated chronic Hepatitis C infection, liver cirrhosis, and umbilical hernia who presented on 10/24/13 with left sided abdominal pain of 5 day duration.  Plan:    Sepsis secondary to MRSA bacteremia thought to be due to PNA complicated by acute hypoxic respiratory failure with left exudative effusion requiring thoracentesis - improving  Pt presented with left sided upper quadrant abdominal and left sided chest pain with tachypnea (28-49)  and leukocytosis (31K) with left shift (>20% bands) and neutrophilia (88%). Lipase within normal limits however on CT abdomen 11/17 there was presence of mild peripancreatic edema and large left pleural effusion concerning for pancreatitis. He reported no recent alcohol use and ethanol levels were negative. Lipid panel also normal. Pt required supplemental oxygen with ABG on 11/18 with normal pH and PaO2 63.7 on 0.50 % venturi mask with repeat abd/CXR/CTabd on 11/18 with evidence of worsening left layering  pleural effusion and continued mild to moderate inflammation of the head of the pancreas with secondary involvement of duodenum.  Bedside thoracentesis was performed on 11/19 with 1L of yellow gold exudative pleural fluid with CXR revealing greatly decreased size of left pleural effusion. Pt with blood culture x2  from 11/17 with MRSA. TEE was performed which was negative for infectious endocarditis.  -Thoracentesis on 11/19--> + Lights' criteria for exudative effusion (protein ratio 0.62, LDH  Ratio 2.85) WBC 3951 (usually empyema 5K to <100K) , pH 8.5  (usually empyema pH<7.2), pleural fluid with WBC's on gram stain, NG on culture   -Awaiting pleural cytology, RF (+), ADA (27 H), ANA, cholesterol (WNL), fungus (NGTD), AFB (NGTD) -Continue IV vancomycin Day 9   -Repeat blood cultures x 2 (11/19) - NGTD  -Per ID treat SAB for minimum of 2 weeks with goal trough 15-20 to Dec 17 after 1st negative blood culture with ID f/u for blood cultures 2 weeks after stopping antibiotics  -Per ID NO central line or PICC line placed until after 11/22--> awaiting PICC line  -Morphine PRN pain -Oxygen therapy if needed to keep SpO2 > 92% --> currently with normal oxygen saturation on RA -Monitor CBC - leukocytosis resolved -PT consult--> no PT f/u, cane     Rash - improving. Pt with development of nonpruritic confluent petechial(?) rash ( 11/21) on palms, elbows, dorsum of feet, and groin area now spreading to  abdomen, soles of feet and buttocks,  concerning for antibiotic reaction. Pt received zoysn on 11/17-11/9 ,  cefazolin 11/19-11/20 and 5 days of vancomycin before development of rash.. Distribution of rash is not consistent with red man's syndrome due to vancomycin infusion reaction. Most likely due to drug reaction to zosyn or  cefazolin than vancomycin. Pt reports he had rash to some type of antibiotic as a child.  -Continue to monitor rash  -Benadryl PRN   Renal Insufficiency - improved. Pt presented with Cr of 1.5 with unknown baseline. Possible etiology is pre-renal azotemia vs ATN (sepsis). Post-renal causes less likely considering no hydronpehrosis on CT abdomen. Pt without HTN, edema, or proteinuria on UA to suggest nephrotic syndrome or glomerulonephritis. UA on 11/19 was normal. -Avoid nephrotoxins (ACEi, ARB, NSAIDS, contrast)  -Continue to monitor BMP   Ileus -  Improved clinically. Etiology unknown, most likely due to severe illness vs mild pancreatitis. Abd  XR on11/19 with ileus. C diff was negative.   -Continue regular diet   -Limit narcotics   Compensated Liver Cirrhosis - stable. Most likely secondary to past alcohol abuse and untreated viral hepatitis C infection. There was evidence of splenomegaly with varies on CT abdomen and distension on exam without other signs of stigmata of chronic liver disease. Pt's albumin is 2.5,  bilirubin 2.4, INR 1.5, ascites that is medically controlled, and encephalopathy medically controlled with Child-Pugh Class C with life expectancy 1-3 years. CT abdomen with findings of cirrhosis with splenomegaly. Pt without ascites, fever, and encephalopathy, however with significant abdominal pain and distension still concern for SBP. Pt with last reported alcoholic drink 1 month ago. Pt reports compliance with diuretic therapy at home. On exam there was distension however hard to appreciate ascites, thus paracentesis was not performed. Ethanol levels were  negative and pt reports quitting heavy alcohol use. Pt with low prealbumin of 1.4,  indicating element of malnutrition. Also with elevated indirect and direct bilirubin. Records obtained from [facility] which revealed no esophageal varices from EGD from 10/2011 (records placed in pt's chart) -Continue PO home lasix 40 mg daily  -Monitor wt --> 210lb to 210 lb(?) since admission -Monitor I &O's--> 2.8 L out since admission -Continue home PO lactulose 20 g TID   Hypotonic Hyponatremia - Improved. Pt asymptomatic. Baseline unknown. Most likely chronic due to liver cirrhosis. Hypotonic (276 serum osm) hypervolemic hyponatremia due to fluid overload. If euvolemic possibly due to SIADH from PNA.  -PO lasix 40 mg daily    -Monitor BMP   Macrocytic Anemia -  Stable. Pt with MCV 103. Possibly due to liver cirrhosis vs alcohol.  -Obtain anemia panel --> B12 high, Folate WNL, Iron WNl, TIBC WNL, UIBC low, high ferritin, high retic count  Chronic Hepatitis C infection - stable.  Pt reports he was never treated. Complicated by liver cirrhosis. On admission pt with transaminitis (elevated AST 42). No evidence of rash, glomerulonephritis, or neuropathy to suggest cryoglobulinemia. Pt was HIV negative and UDS was negative.  -Limit tylenol use -Acute hepatitis panel --> HepS Ab neg, Hep A (NR), Hep C (reactive) -Hepatitis A vaccine -Pt should receive HCV treatment at [facility]  Thrombocytopenia - resolved. Pt with splenomegaly and platelet count on admission of 126K (clumping present). Baseline platelet count unknown. No reports of recent bleeding.  -Continue to monitor CBC    Diet: regular DVT PPx: SCDs  Code: Full if >50% chance of survival      Dispo: Disposition is deferred at this time, awaiting improvement of current medical problems.  Anticipated discharge in approximately 5 day(s).   The patient does not have a current PCP (No Pcp Per Patient) and does need an [facility] hospital follow-up appointment  after discharge.  The patient does not have transportation limitations that hinder transportation to clinic appointments.  .Services Needed at time of discharge: Y = Yes, Blank = No PT:   OT:   RN:   Equipment:   Other:     LOS: 8 days   Otis Brace, MD 11/01/2013, 7:48 AM

## 2013-11-01 NOTE — Evaluation (Signed)
Occupational Therapy Evaluation Patient Details Name: [name] [name] MRN: [id number] DOB: 1951-05-17 Today's Date: 11/01/2013 Time: 8295-6213 OT Time Calculation (min): 31 min  OT Assessment / Plan / Recommendation History of present illness Pt is an independent 62 y.o. Male who was recently treated for UTI at the [place] in [place]. Adm to [facility] secondary to n/v, chronic loose stool. Pt has been on home diuretic therapy secondary to cirrhosis. Pt found to have elevated WBC and to be mildy hyponatremia with elevated Cr. Pt being treated for sepsis.     Clinical Impression   Pt is Mod I - sup level with ADLs and ADL mobility. All education completed and no further acute OT services indicated at this time    OT Assessment  Patient does not need any further OT services    Follow Up Recommendations  No OT follow up    Barriers to Discharge  none    Equipment Recommendations  None recommended by OT    Recommendations for Other Services    Frequency       Precautions / Restrictions Precautions Precautions: None Restrictions Weight Bearing Restrictions: No   Pertinent Vitals/Pain 6/10 L UE/rib area    ADL  Grooming: Performed;Wash/dry hands;Wash/dry face;Brushing hair;Modified independent;Supervision/safety Where Assessed - Grooming: Unsupported standing Upper Body Bathing: Simulated;Modified independent Lower Body Bathing: Simulated;Supervision/safety Upper Body Dressing: Performed;Modified independent Lower Body Dressing: Performed;Supervision/safety Toilet Transfer: Research scientist (life sciences) Method: Sit to Barista: Regular height toilet;Grab bars Toileting - Architect and Hygiene: Performed;Supervision/safety Where Assessed - Engineer, mining and Hygiene: Standing Tub/Shower Transfer: Engineer, manufacturing Method: Science writer: Grab bars;Walk in  shower Equipment Used: Gait belt Transfers/Ambulation Related to ADLs: supervision for safety and min cues; requires UE support to come to standing position from lower surface (ie toilet)     OT Diagnosis:    OT Problem List:   OT Treatment Interventions:     OT Goals(Current goals can be found in the care plan section) Acute Rehab OT Goals Patient Stated Goal: to get back to walking   Visit Information  Last OT Received On: 11/01/13 Assistance Needed: +1 History of Present Illness: Pt is an independent 62 y.o. Male who was recently treated for UTI at the [place] in [place]. Adm to [facility] secondary to n/v, chronic loose stool. Pt has been on home diuretic therapy secondary to cirrhosis. Pt found to have elevated WBC and to be mildy hyponatremia with elevated Cr. Pt being treated for sepsis.         Prior Functioning     Home Living Family/patient expects to be discharged to:: Private residence Living Arrangements: Alone Available Help at Discharge: Family;Available PRN/intermittently Type of Home: Apartment Home Access: Stairs to enter Entrance Stairs-Number of Steps: 5 Entrance Stairs-Rails: Right Home Layout: One level Home Equipment: None Prior Function Level of Independence: Independent Comments: pt does not drive; daughter brings his groceries  Communication Communication: No difficulties Dominant Hand:  (signs name with R hand)         Vision/Perception Vision - History Baseline Vision: Wears glasses all the time Patient Visual Report: No change from baseline Perception Perception: Within Functional Limits   Cognition  Cognition Arousal/Alertness: Awake/alert Behavior During Therapy: WFL for tasks assessed/performed Overall Cognitive Status: Within Functional Limits for tasks assessed    Extremity/Trunk Assessment Upper Extremity Assessment Upper Extremity Assessment: Overall WFL for tasks assessed Lower Extremity Assessment Lower Extremity Assessment: Defer to  PT evaluation Cervical / Trunk Assessment Cervical /  Trunk Assessment: Normal     Mobility Bed Mobility Bed Mobility: Sit to Supine;Sitting - Scoot to Edge of Bed Supine to Sit: 5: Supervision;HOB elevated;With rails Sitting - Scoot to Edge of Bed: 6: Modified independent (Device/Increase time) Transfers Sit to Stand: From bed;From toilet;5: Supervision Stand to Sit: To toilet;With upper extremity assist;5: Supervision Details for Transfer Assistance: supervision for safety and min cues; requires UE support to come to standing position from lower surface (ie toilet)      Exercise     Balance Balance Balance Assessed: Yes Static Sitting Balance Static Sitting - Balance Support: Bilateral upper extremity supported;Feet supported Static Sitting - Level of Assistance: 7: Independent Dynamic Sitting Balance Dynamic Sitting - Balance Support: Feet supported;No upper extremity supported;During functional activity Dynamic Sitting - Level of Assistance: 7: Independent Static Standing Balance Static Standing - Balance Support: No upper extremity supported;During functional activity Static Standing - Level of Assistance: 5: Stand by assistance Dynamic Standing Balance Dynamic Standing - Balance Support: No upper extremity supported;During functional activity Dynamic Standing - Level of Assistance: 5: Stand by assistance   End of Session OT - End of Session Equipment Utilized During Treatment: Gait belt Activity Tolerance: Patient tolerated treatment well Patient left: Other (comment) (ambulating with PT)  GO     Galen Manila 11/01/2013, 12:31 PM

## 2013-11-01 NOTE — Progress Notes (Signed)
Patient discharge teaching given, including activity, diet, follow-up appoints, and medications. Patient verbalized understanding of all discharge instructions. IV access was d/c'd. Vitals are stable. Skin is intact except as charted in most recent assessments. Pt to be escorted out by NT, to be driven home by family.  [name] [name], MBA, BS, RN 

## 2013-11-01 NOTE — Progress Notes (Signed)
Peripherally Inserted Central Catheter/Midline Placement  The IV Nurse has discussed with the patient and/or persons authorized to consent for the patient, the purpose of this procedure and the potential benefits and risks involved with this procedure.  The benefits include less needle sticks, lab draws from the catheter and patient may be discharged home with the catheter.  Risks include, but not limited to, infection, bleeding, blood clot (thrombus formation), and puncture of an artery; nerve damage and irregular heat beat.  Alternatives to this procedure were also discussed.  PICC/Midline Placement Documentation        [name] [name] 11/01/2013, 9:47 AM

## 2013-11-09 ENCOUNTER — Inpatient Hospital Stay: Payer: Medicaid Other | Admitting: Internal Medicine

## 2013-11-09 ENCOUNTER — Emergency Department (HOSPITAL_COMMUNITY)
Admission: EM | Admit: 2013-11-09 | Discharge: 2013-11-10 | Disposition: A | Discharge status: Home / Self Care | Payer: Medicaid Other | Attending: Emergency Medicine | Admitting: Emergency Medicine

## 2013-11-09 ENCOUNTER — Encounter (HOSPITAL_COMMUNITY): Payer: Self-pay | Admitting: Emergency Medicine

## 2013-11-09 DIAGNOSIS — Z880 Allergy status to penicillin: Secondary | ICD-10-CM | POA: Insufficient documentation

## 2013-11-09 DIAGNOSIS — K8020 Calculus of gallbladder without cholecystitis without obstruction: Secondary | ICD-10-CM | POA: Insufficient documentation

## 2013-11-09 DIAGNOSIS — R1010 Upper abdominal pain, unspecified: Secondary | ICD-10-CM

## 2013-11-09 DIAGNOSIS — I10 Essential (primary) hypertension: Secondary | ICD-10-CM | POA: Insufficient documentation

## 2013-11-09 DIAGNOSIS — Z87891 Personal history of nicotine dependence: Secondary | ICD-10-CM | POA: Insufficient documentation

## 2013-11-09 DIAGNOSIS — R112 Nausea with vomiting, unspecified: Secondary | ICD-10-CM

## 2013-11-09 DIAGNOSIS — Z79899 Other long term (current) drug therapy: Secondary | ICD-10-CM | POA: Insufficient documentation

## 2013-11-09 DIAGNOSIS — Z8619 Personal history of other infectious and parasitic diseases: Secondary | ICD-10-CM | POA: Insufficient documentation

## 2013-11-09 LAB — COMPREHENSIVE METABOLIC PANEL
ALT: 33 U/L (ref 0–53)
Alkaline Phosphatase: 116 U/L (ref 39–117)
BUN: 14 mg/dL (ref 6–23)
CO2: 27 mEq/L (ref 19–32)
Chloride: 95 mEq/L — ABNORMAL LOW (ref 96–112)
GFR calc Af Amer: 66 mL/min — ABNORMAL LOW (ref >90)
GFR calc non Af Amer: 57 mL/min — ABNORMAL LOW (ref >90)
Glucose, Bld: 151 mg/dL — ABNORMAL HIGH (ref 70–99)
Potassium: 4.5 mEq/L (ref 3.5–5.1)
Sodium: 129 mEq/L — ABNORMAL LOW (ref 135–145)
Total Bilirubin: 2.4 mg/dL — ABNORMAL HIGH (ref 0.3–1.2)
Total Protein: 8.8 g/dL — ABNORMAL HIGH (ref 6.0–8.3)

## 2013-11-09 LAB — CBC WITH DIFFERENTIAL/PLATELET
Eosinophils Absolute: 0 10*3/uL (ref 0.0–0.7)
HCT: 37 % — ABNORMAL LOW (ref 39.0–52.0)
Hemoglobin: 13.2 g/dL (ref 13.0–17.0)
Lymphocytes Relative: 8 % — ABNORMAL LOW (ref 12–46)
Lymphs Abs: 0.9 10*3/uL (ref 0.7–4.0)
MCH: 37.1 pg — ABNORMAL HIGH (ref 26.0–34.0)
MCV: 103.9 fL — ABNORMAL HIGH (ref 78.0–100.0)
Monocytes Absolute: 0.9 10*3/uL (ref 0.1–1.0)
Monocytes Relative: 7 % (ref 3–12)
Neutrophils Relative %: 84 % — ABNORMAL HIGH (ref 43–77)
Platelets: 170 10*3/uL (ref 150–400)
RBC: 3.56 MIL/uL — ABNORMAL LOW (ref 4.22–5.81)
WBC: 11.9 10*3/uL — ABNORMAL HIGH (ref 4.0–10.5)

## 2013-11-09 LAB — LIPASE, BLOOD: Lipase: 50 U/L (ref 11–59)

## 2013-11-09 LAB — POCT I-STAT TROPONIN I

## 2013-11-09 MED ORDER — ONDANSETRON HCL 4 MG/2ML IJ SOLN
4.0000 mg | Freq: Once | INTRAMUSCULAR | Status: AC
  Administered 2013-11-09: 4 mg via INTRAVENOUS
  Filled 2013-11-09: qty 2

## 2013-11-09 MED ORDER — ONDANSETRON 4 MG PO TBDP
8.0000 mg | ORAL_TABLET | Freq: Once | ORAL | Status: AC
  Administered 2013-11-09: 8 mg via ORAL
  Filled 2013-11-09: qty 2

## 2013-11-09 MED ORDER — MORPHINE SULFATE 4 MG/ML IJ SOLN
4.0000 mg | Freq: Once | INTRAMUSCULAR | Status: AC
  Administered 2013-11-09: 4 mg via INTRAVENOUS
  Filled 2013-11-09: qty 1

## 2013-11-09 MED ORDER — SODIUM CHLORIDE 0.9 % IV BOLUS (SEPSIS)
1000.0000 mL | Freq: Once | INTRAVENOUS | Status: AC
  Administered 2013-11-09: 1000 mL via INTRAVENOUS

## 2013-11-09 NOTE — ED Notes (Signed)
Otter MD at bedside. 

## 2013-11-09 NOTE — ED Notes (Signed)
C/o nausea, vomiting, and abd pain since eating split pea soup yesterday.  Reports vomiting x 5 today.  Denies diarrhea.

## 2013-11-10 ENCOUNTER — Emergency Department (HOSPITAL_COMMUNITY): Payer: Medicaid Other

## 2013-11-10 LAB — URINALYSIS, ROUTINE W REFLEX MICROSCOPIC
Bilirubin Urine: NEGATIVE
Hgb urine dipstick: NEGATIVE
Ketones, ur: NEGATIVE mg/dL
Leukocytes, UA: NEGATIVE
Nitrite: NEGATIVE
Protein, ur: NEGATIVE mg/dL
Urobilinogen, UA: 1 mg/dL (ref 0.0–1.0)
pH: 7 (ref 5.0–8.0)

## 2013-11-10 MED ORDER — PANTOPRAZOLE SODIUM 40 MG IV SOLR
40.0000 mg | Freq: Once | INTRAVENOUS | Status: AC
  Administered 2013-11-10: 40 mg via INTRAVENOUS
  Filled 2013-11-10: qty 40

## 2013-11-10 MED ORDER — MORPHINE SULFATE 4 MG/ML IJ SOLN: 4.0000 mg | INTRAMUSCULAR | Status: DC | PRN

## 2013-11-10 MED ORDER — SODIUM CHLORIDE 0.9 % IV BOLUS (SEPSIS)
1000.0000 mL | Freq: Once | INTRAVENOUS | Status: AC
  Administered 2013-11-10: 1000 mL via INTRAVENOUS

## 2013-11-10 MED ORDER — OXYCODONE HCL 5 MG PO TABS: 5.0000 mg | ORAL_TABLET | ORAL | Status: AC | PRN

## 2013-11-10 MED ORDER — PANTOPRAZOLE SODIUM 20 MG PO TBEC: 40.0000 mg | DELAYED_RELEASE_TABLET | Freq: Every day | ORAL | Status: AC

## 2013-11-10 MED ORDER — GI COCKTAIL ~~LOC~~
30.0000 mL | Freq: Once | ORAL | Status: AC
  Administered 2013-11-10: 30 mL via ORAL
  Filled 2013-11-10: qty 30

## 2013-11-10 MED ORDER — ONDANSETRON 8 MG PO TBDP: 8.0000 mg | ORAL_TABLET | Freq: Three times a day (TID) | ORAL | Status: AC | PRN

## 2013-11-10 NOTE — ED Provider Notes (Signed)
CSN: [id number]     Arrival date & time 11/09/13  2117 History   First MD Initiated Contact with Patient 11/09/13 2300     Chief Complaint  Patient presents with  . Abdominal Pain  . Emesis   (Consider location/radiation/quality/duration/timing/severity/associated sxs/prior Treatment) HPI 62 year old male presents to emergency room with complaint of nausea, vomiting, and upper abdominal pain ongoing since this afternoon.  Pain is sharp crampy, given the epigastric region. He reports vomiting multiple times.  Patient has past medical history of cirrhosis of the liver, hepatitis C.  He had recent admission for MRSA pneumonia, complicated with bacteremia.  He is receiving IV vancomycin for this.  Patient denies any fever or chills.  He is having normal loose stools due to on his lactulose.  He denies missing any doses of his medicines.  Past Medical History  Diagnosis Date  . Cirrhosis   . Hepatitis C   . Inguinal hernia     "for a really long time"  . Hypertension   . Umbilical hernia    Past Surgical History  Procedure Laterality Date  . Tee without cardioversion N/A 10/28/2013    Procedure: TRANSESOPHAGEAL ECHOCARDIOGRAM (TEE);  Surgeon: Wendall Stade, MD;  Location: [facility] ENDOSCOPY;  Service: Cardiovascular;  Laterality: N/A;   No family history on file. History  Substance Use Topics  . Smoking status: Former Games developer  . Smokeless tobacco: Not on file  . Alcohol Use: No     Comment: Last etoh one month    Review of Systems  All other systems reviewed and are negative.    Allergies  Cephalosporins; Codeine; and Penicillins  Home Medications   Current Outpatient Rx  Name  Route  Sig  Dispense  Refill  . alum & mag hydroxide-simeth (MAALOX/MYLANTA) 200-200-20 MG/5ML suspension   Oral   Take 30 mLs by mouth once.         . furosemide (LASIX) 40 MG tablet   Oral   Take 40 mg by mouth daily.         Marland Kitchen lactulose (CHRONULAC) 10 GM/15ML solution   Oral   Take 20 g by  mouth 3 (three) times daily.         . potassium chloride SA (K-DUR,KLOR-CON) 20 MEQ tablet   Oral   Take 1 tablet (20 mEq total) by mouth daily.   30 tablet   3   . spironolactone (ALDACTONE) 100 MG tablet   Oral   Take 100 mg by mouth daily.         . Vancomycin (VANCOCIN) 750 MG/150ML SOLN   Intravenous   Inject 150 mLs (750 mg total) into the vein every 12 (twelve) hours.   6600 mL   0     150 mL twice a day until 11/23/13    BP 128/76  Pulse 74  Temp(Src) 97.7 F (36.5 C) (Oral)  Resp 26  Wt 190 lb 14.4 oz (86.592 kg)  SpO2 95% Physical Exam  Nursing note and vitals reviewed. Constitutional: He is oriented to person, place, and time. He appears well-developed and well-nourished. He appears distressed.  Chronically ill appearing male, uncomfortable appearing  HENT:  Head: Normocephalic and atraumatic.  Nose: Nose normal.  Mouth/Throat: Oropharynx is clear and moist.  Eyes: Conjunctivae and EOM are normal. Pupils are equal, round, and reactive to light.  Neck: Normal range of motion. Neck supple. No JVD present. No tracheal deviation present. No thyromegaly present.  Cardiovascular: Normal rate, regular rhythm, normal heart sounds  and intact distal pulses.  Exam reveals no gallop and no friction rub.   No murmur heard. Pulmonary/Chest: Effort normal and breath sounds normal. No stridor. No respiratory distress. He has no wheezes. He has no rales. He exhibits no tenderness.  Abdominal: Soft. Bowel sounds are normal. He exhibits no distension and no mass. There is tenderness (tender to palpation in epigastrium). There is no rebound and no guarding.  Musculoskeletal: Normal range of motion. He exhibits no edema and no tenderness.  Lymphadenopathy:    He has no cervical adenopathy.  Neurological: He is alert and oriented to person, place, and time. He exhibits normal muscle tone. Coordination normal.  Skin: Skin is dry. No rash noted. No erythema. No pallor.   Jaundice noted  Psychiatric: He has a normal mood and affect. His behavior is normal. Judgment and thought content normal.    ED Course  Procedures (including critical care time) Labs Review Labs Reviewed  CBC WITH DIFFERENTIAL - Abnormal; Notable for the following:    WBC 11.9 (*)    RBC 3.56 (*)    HCT 37.0 (*)    MCV 103.9 (*)    MCH 37.1 (*)    Neutrophils Relative % 84 (*)    Neutro Abs 10.1 (*)    Lymphocytes Relative 8 (*)    All other components within normal limits  COMPREHENSIVE METABOLIC PANEL - Abnormal; Notable for the following:    Sodium 129 (*)    Chloride 95 (*)    Glucose, Bld 151 (*)    Total Protein 8.8 (*)    Albumin 2.3 (*)    AST 54 (*)    Total Bilirubin 2.4 (*)    GFR calc non Af Amer 57 (*)    GFR calc Af Amer 66 (*)    All other components within normal limits  URINALYSIS, ROUTINE W REFLEX MICROSCOPIC - Abnormal; Notable for the following:    Color, Urine AMBER (*)    All other components within normal limits  LIPASE, BLOOD  POCT I-STAT TROPONIN I   Imaging Review US Abdomen Complete  11/10/2013   CLINICAL DATA:  History of gallstones and abdominal pain. Cirrhosis. Hepatitis C. hypertension. Umbilical hernia.  EXAM: ULTRASOUND ABDOMEN COMPLETE  COMPARISON:  CT 10/25/2013  FINDINGS: Gallbladder:  Multiple gallstones. Not well visualized. No definite wall thickening or pericholecystic fluid.  Common bile duct:  Diameter: Not visualized  Liver:  Poorly visualized.  Cirrhosis.  IVC:  Not visualized  Pancreas:  Not visualized  Spleen:  Mild splenomegaly, 13.7 cm.  Splenic varices identified.  Right Kidney:  Length: 12.2 cm. Mildly increased in echogenicity. No hydronephrosis.  Left Kidney:  Length: 12.8 cm.  Not well visualized. No hydronephrosis.  Abdominal aorta:  Not visualized.  Other findings:  No ascites.  Moderate to markedly degraded exam secondary to overlying umbilical hernia and bowel gas.  IMPRESSION: 1. Cholelithiasis without definite evidence  of acute cholecystitis. 2. Otherwise, moderately degraded exam secondary to bowel gas and overlying umbilical hernia. 3. Cirrhosis. 4. Increased renal echogenicity, suggesting medical renal disease. 5. Splenomegaly with splenic varices.   Electronically Signed   By: Jeronimo Greaves M.D.   On: 11/10/2013 01:00    EKG Interpretation   None       MDM   1. Nausea and vomiting   2. Upper abdominal pain   3. Gallstones    62 year old male with upper abdominal pain, nausea and vomiting, history of liver failure./Services.  Currently on IV vancomycin for  recent MRSA bacteremia.  CT scan done last month showed gallstones.  We'll get ultrasound of abdomen.  Will treat nausea and vomiting and pain.  Will rehydrate with IV fluids.  Pt with good pain control, no further vomiting.  Mild nausea with crackers/gingerale and upper abd burning.  Unclear if pain is from gallstones or gastritis.  Will treat with protonix, and close f/u with his gi doctor, surgery and pcm.  Olivia Mackie, MD 11/10/13 [phone]

## 2013-11-10 NOTE — Discharge Summary (Signed)
I assisted with the discharge planning of [name] [name].

## 2013-11-14 ENCOUNTER — Telehealth: Payer: Self-pay | Admitting: Infectious Disease

## 2013-11-14 NOTE — Telephone Encounter (Signed)
   His serum cr is up to 1.48 Vanco is 19  He is on lasix 40 daily  Would like him to hold lasix for 3 days and recheck bmp on Thursday with [facility]

## 2013-11-15 ENCOUNTER — Telehealth: Payer: Self-pay | Admitting: *Deleted

## 2013-11-15 NOTE — Telephone Encounter (Signed)
Spoke with [facility] from [facility] and she will make sure nursing rechecks BMP on Thursday.

## 2013-11-15 NOTE — Telephone Encounter (Signed)
[facility] RN checked Dr. Zenaida Niece Dam's telephone note of 11/14/13.  Phone note stated to "hold" Lasix dose for 3 days.  Spoke with [facility] Pharmacist.  [facility] pharmacist to restart Vancomycin tomorrow and have BMP drawn on 11/17/13.  Pt coming for MD appt tomorrow.  RN spoke with Dr. Daiva Eves and informed him about the Vancomycin "stop" and Lasix not "stopped."

## 2013-11-16 ENCOUNTER — Encounter: Payer: Self-pay | Admitting: Internal Medicine

## 2013-11-16 ENCOUNTER — Ambulatory Visit (INDEPENDENT_AMBULATORY_CARE_PROVIDER_SITE_OTHER): Payer: Medicaid Other | Admitting: Internal Medicine

## 2013-11-16 VITALS — BP 107/73 | HR 80 | Temp 97.8°F | Wt 186.0 lb

## 2013-11-16 VITALS — BP 114/79 | HR 83 | Temp 98.2°F | Wt 187.8 lb

## 2013-11-16 DIAGNOSIS — A4902 Methicillin resistant Staphylococcus aureus infection, unspecified site: Secondary | ICD-10-CM

## 2013-11-16 DIAGNOSIS — A4901 Methicillin susceptible Staphylococcus aureus infection, unspecified site: Secondary | ICD-10-CM

## 2013-11-16 DIAGNOSIS — N179 Acute kidney failure, unspecified: Secondary | ICD-10-CM

## 2013-11-16 DIAGNOSIS — I10 Essential (primary) hypertension: Secondary | ICD-10-CM

## 2013-11-16 DIAGNOSIS — R7881 Bacteremia: Secondary | ICD-10-CM

## 2013-11-16 NOTE — Patient Instructions (Signed)
-  Continue holding lasix 40 mg daily until you are told to resume it -Continue taking your home medications -Please follow-up with [facility] clinic next week  -[facility] -Happy Holidays!!

## 2013-11-16 NOTE — Progress Notes (Signed)
Subjective:    Patient ID: [name] [name], male    DOB: September 18, 1951, 62 y.o.   MRN: [id number]  HPI  62 yo M with chronic Hep C was hospitalized from 11/27-11/25 for staph aureus bacteremia. His blood cultures were cleared 11/19. He underwent TEE that did not show any valvular vegetations. It was recommended for him to be treated as complicated bacteremia for 4 wks. He is to continue his antibiotics until Dec 17th. He has been doing well on vancomycin, however the labs for this week showed an increase in his Cr and thus his vancomycin dose was changed to once a day due to changes in kidney function. Tonight, he will have his 2nd dose of once a day. He was previously also taking lasix for which that has been held over the last day. He reports no difficulty with picc line or with infusion. He denies fever, chills, but did have one episode of  nightsweats last night, but felt he kept his house warm.  No rash, diarrhea due to antibiotics  Current Outpatient Prescriptions on File Prior to Visit  Medication Sig Dispense Refill  . alum & mag hydroxide-simeth (MAALOX/MYLANTA) 200-200-20 MG/5ML suspension Take 30 mLs by mouth once.      . furosemide (LASIX) 40 MG tablet Take 40 mg by mouth daily.      . ondansetron (ZOFRAN ODT) 8 MG disintegrating tablet Take 1 tablet (8 mg total) by mouth every 8 (eight) hours as needed for nausea or vomiting.  20 tablet  0  . oxyCODONE (ROXICODONE) 5 MG immediate release tablet Take 1 tablet (5 mg total) by mouth every 4 (four) hours as needed for severe pain.  15 tablet  0  . pantoprazole (PROTONIX) 20 MG tablet Take 2 tablets (40 mg total) by mouth daily.  30 tablet  0  . potassium chloride SA (K-DUR,KLOR-CON) 20 MEQ tablet Take 1 tablet (20 mEq total) by mouth daily.  30 tablet  3  . spironolactone (ALDACTONE) 100 MG tablet Take 100 mg by mouth daily.      . Vancomycin (VANCOCIN) 750 MG/150ML SOLN Inject 150 mLs (750 mg total) into the vein every 12 (twelve) hours.   6600 mL  0   No current facility-administered medications on file prior to visit.   Active Ambulatory Problems    Diagnosis Date Noted  . Sepsis 10/24/2013  . Hep C w/o coma, chronic 10/24/2013  . Liver cirrhosis 10/24/2013  . Acute respiratory failure with hypoxia 10/25/2013  . Ileus, acute 10/25/2013  . Pancreatitis, acute 10/25/2013  . Large Pleural effusion on left 10/25/2013  . AKI (acute kidney injury) 10/25/2013  . Chronic kidney disease (CKD), stage IV (severe) 10/25/2013  . Hyponatremia 10/25/2013  . Hypoalbuminemia 10/25/2013  . Hyperbilirubinemia 10/25/2013  . Essential hypertension, benign 10/25/2013  . Thrombocytopenia, chronic 10/25/2013   Resolved Ambulatory Problems    Diagnosis Date Noted  . No Resolved Ambulatory Problems   Past Medical History  Diagnosis Date  . Cirrhosis   . Hepatitis C   . Inguinal hernia   . Hypertension   . Umbilical hernia       Review of Systems  Constitutional: Negative for fever, chills, diaphoresis, activity change, appetite change, fatigue and unexpected weight change.  HENT: Negative for congestion, sore throat, rhinorrhea, sneezing, trouble swallowing and sinus pressure.  Eyes: Negative for photophobia and visual disturbance.  Respiratory: Negative for cough, chest tightness, shortness of breath, wheezing and stridor.  Cardiovascular: Negative for chest pain, palpitations and  leg swelling.  Gastrointestinal: Negative for nausea, vomiting, abdominal pain, diarrhea, constipation, blood in stool, abdominal distention and anal bleeding.  Genitourinary: Negative for dysuria, hematuria, flank pain and difficulty urinating.  Musculoskeletal: Negative for myalgias, back pain, joint swelling, arthralgias and gait problem.  Skin: Negative for color change, pallor, rash and wound.  Neurological: Negative for dizziness, tremors, weakness and light-headedness.  Hematological: Negative for adenopathy. Does not bruise/bleed easily.    Psychiatric/Behavioral: Negative for behavioral problems, confusion, sleep disturbance, dysphoric mood, decreased concentration and agitation.       Objective:   Physical Exam BP 107/73  Pulse 80  Temp(Src) 97.8 F (36.6 C) (Oral)  Wt 186 lb (84.369 kg) Physical Exam  Constitutional: He is oriented to person, place, and time. He appears well-developed and well-nourished. No distress.  HENT:  Mouth/Throat: Oropharynx is clear and moist. No oropharyngeal exudate.  Cardiovascular: Normal rate, regular rhythm and normal heart sounds. Exam reveals no gallop and no friction rub.  No murmur heard.  Pulmonary/Chest: Effort normal and breath sounds normal. No respiratory distress. He has no wheezes.  Abdominal: Soft. Bowel sounds are normal. He exhibits no distension. There is no tenderness.  Lymphadenopathy:  He has no cervical adenopathy.  Ext: right arm picc line c/d/i Neurological: He is alert and oriented to person, place, and time.  Skin: Skin is warm and dry. No rash noted. No erythema.  Psychiatric: He has a normal mood and affect. His behavior is normal.       Assessment & Plan:  SAB = continue with vancomycin with goal trough of 15-20. We will continue treatment for complicated bacteremia to treat until dec 17th.  We will call [facility] home health to let them known that the patient is only on 2nd dose of vanco today, and that lab work on 12/11 will only be for kidney function. Too early to check vanco trough at this current dosing schedule.  aki = recent blood work revealed increase in cr. Lasix stopped and vanco interval changed. Labs pending to ensure that cr is no worse  rtc in 1 wk

## 2013-11-18 NOTE — Progress Notes (Signed)
Patient ID: [name] [name], male   DOB: 30-Jul-1951, 62 y.o.   MRN: [id number]   Subjective:   Patient ID: [name] [name] male   DOB: 11-04-1951 62 y.o.   MRN: [id number]  HPI: [name] is a 62 y.o. very pleasant [employer] [facility] with past medical history of hypertension, untreated chronic Hepatitis C infection, compensated liver cirrhosis, and CKD stage 3 who presents for hospital follow-up visit.   Patient was hospitalized from 11/17-11/25 for left sided pleuritic chest pain and LUQ abdominal pain who was found to have sepsis secondary to MRSA bacteremia  (without evidence of IE) thought to be due to MRSA pneumonia complicated by parapneumonic effusion requiring thoracentesis with negative work-up to date. Pt was started on IV vancomycin on 11/17 and PICC line was placed at time of discharge with administration to be continued until 11/23/13 which pt reports he has been doing himself.  Pt was also found to have acute hypoxic respiratory failure requiring oxygen supplementation that resolved. Pt was also found to have a non-pruritic confluent petechial rash on palms, elbows, dorsum of feet, and groin area that began on 11/21 with spread to extremities, abdomen, soles of feet, and buttocks and then began to disappear on 11/24 most likely due to drug reaction to zosyn or cefazolin which has since resolved completely. Pt was also found to have evidence of ileus during hospitalization possibly due to mild pancreatitis that also resolved.   He was just recently seen in the ED on 12/3  for nausea, vomiting, and epigastric pain. [place] of abdomen revealed evidence of cholelithiasis. Pt received IV fluids and was prescribed protonix which pt reports has helped with resolution of symptoms. Per records his renal function was elevated on 12/8 and  pt was to stop taking lasix for 3 days with recheck BMP tomorrow (12/11) but due to misunderstanding,  pt stopped taking his lactulose instead.     He currently denies fever,  chills, dyspnea, cough, pleuritic chest pain, nausea, vomiting, abdominal pain, fatigue, decreased appetite, weight loss, lightheadedness, absence of passage of gas, or change in BM and urination,  rash, or pruritis.    Pt is a [place] veteran but would like to establish care at [facility].     Past Medical History  Diagnosis Date  . Cirrhosis   . Hepatitis C   . Inguinal hernia     "for a really long time"  . Hypertension   . Umbilical hernia    Current Outpatient Prescriptions  Medication Sig Dispense Refill  . alum & mag hydroxide-simeth (MAALOX/MYLANTA) 200-200-20 MG/5ML suspension Take 30 mLs by mouth once.      . furosemide (LASIX) 40 MG tablet Take 40 mg by mouth daily.      . ondansetron (ZOFRAN ODT) 8 MG disintegrating tablet Take 1 tablet (8 mg total) by mouth every 8 (eight) hours as needed for nausea or vomiting.  20 tablet  0  . oxyCODONE (ROXICODONE) 5 MG immediate release tablet Take 1 tablet (5 mg total) by mouth every 4 (four) hours as needed for severe pain.  15 tablet  0  . pantoprazole (PROTONIX) 20 MG tablet Take 2 tablets (40 mg total) by mouth daily.  30 tablet  0  . potassium chloride SA (K-DUR,KLOR-CON) 20 MEQ tablet Take 1 tablet (20 mEq total) by mouth daily.  30 tablet  3  . spironolactone (ALDACTONE) 100 MG tablet Take 100 mg by mouth daily.      . Vancomycin (VANCOCIN) 750 MG/150ML SOLN Inject 150  mLs (750 mg total) into the vein every 12 (twelve) hours.  6600 mL  0   No current facility-administered medications for this visit.   No family history on file. History   Social History  . Marital Status: Single    Spouse Name: N/A    Number of Children: N/A  . Years of Education: N/A   Social History Main Topics  . Smoking status: Former Games developer  . Smokeless tobacco: None  . Alcohol Use: No     Comment: Last etoh one month  . Drug Use: No  . Sexual Activity: None   Other Topics Concern  . None   Social History Narrative  . None   Review of  Systems: Review of Systems  Constitutional: Negative for fever, chills, weight loss, malaise/fatigue and diaphoresis.  HENT: Negative for congestion and sore throat.   Eyes: Negative for blurred vision.  Respiratory: Negative for cough and shortness of breath.   Cardiovascular: Positive for leg swelling (trace ). Negative for chest pain and palpitations.  Gastrointestinal: Positive for heartburn (imrpoved) and constipation. Negative for nausea, vomiting, abdominal pain, diarrhea and blood in stool.  Genitourinary: Negative for dysuria, urgency, frequency and hematuria.  Musculoskeletal: Negative for back pain, joint pain and myalgias.  Skin: Negative for rash.  Neurological: Negative for dizziness, tingling, sensory change, weakness and headaches.  Psychiatric/Behavioral: Negative for depression and substance abuse. The patient does not have insomnia.     Objective:  Physical Exam: Filed Vitals:   11/16/13 0949  BP: 114/79  Pulse: 83  Temp: 98.2 F (36.8 C)  TempSrc: Oral  Weight: 187 lb 12.8 oz (85.186 kg)  SpO2: 96%   Physical Exam  Constitutional: He is oriented to person, place, and time. He appears well-developed and well-nourished. No distress.  HENT:  Head: Normocephalic and atraumatic.  Right Ear: External ear normal.  Left Ear: External ear normal.  Nose: Nose normal.  Mouth/Throat: Oropharynx is clear and moist. No oropharyngeal exudate.  Eyes: Conjunctivae and EOM are normal. Pupils are equal, round, and reactive to light. Right eye exhibits no discharge. Left eye exhibits no discharge.  Neck: Normal range of motion. Neck supple.  Cardiovascular: Normal rate, regular rhythm and normal heart sounds.   Pulmonary/Chest: Effort normal and breath sounds normal. No respiratory distress. He has no wheezes. He has no rales. He exhibits no tenderness.  Abdominal: Soft. Bowel sounds are normal. He exhibits no distension. There is no tenderness. There is no rebound and no  guarding.  Musculoskeletal: Normal range of motion. He exhibits edema (trace b/l nonpitting ). He exhibits no tenderness.  Neurological: He is alert and oriented to person, place, and time. No cranial nerve deficit. He exhibits normal muscle tone. Coordination normal.  Skin: Skin is warm and dry. No rash noted. He is not diaphoretic. No erythema. No pallor.  Psychiatric: He has a normal mood and affect. His behavior is normal. Judgment and thought content normal.    Assessment & Plan:  Please see problem list for problem-based assessment and plan

## 2013-11-18 NOTE — Assessment & Plan Note (Signed)
Assessment: Pt with recent hospitalization from 11/17-11/25 for MRSA bacteremia complicated by parapneumonic effusion (possibly due to MRSA PNA) with acute hypoxic respiratory failure requiring thoracentesis with negative work-up to date who is on Day 24 of IV vancomycin that is to continue until 11/23/13 (4 total weeks) with recent AKI and adjustment of vancomycin who presents without any acute complaints.     Plan:  -Pt instructed to hold Lasix 40 mg due to AKI (Cr 1.48) possibly from vancomycin, with repeat BMP on 12/11 per [facility]   -Pt to follow-up with ID clinic for repeat blood cultures and chest-xray  -Pt is [facility] pt however would like to establish care with our clinic (pt to find out if possible)

## 2013-11-18 NOTE — Assessment & Plan Note (Signed)
Assessment: Pt with well-controlled blood pressure who is compliant with one class antihypertensive therapy (diuretic) who presents with blood pressure of 114/79.   Plan: -BP 114/79 at goal <140/80 -Hold furosemide 40 mg daily until repeat BMP due to AKI -Continue spironolactone 100 mg daily

## 2013-11-21 ENCOUNTER — Telehealth: Payer: Self-pay | Admitting: *Deleted

## 2013-11-21 NOTE — Telephone Encounter (Signed)
Left message on patient's voicemail informing him that Dr. Drue Second wanted him to return to clinic in 1 week (12/17), but he did not make an appointment at check out.  Gave appointment of 12/16 at 9:15 (last available with Dr. Drue Second for this week).  Asked patient to call back and confirm this appointment. Andree Coss, RN

## 2013-11-22 ENCOUNTER — Ambulatory Visit: Payer: Medicaid Other | Admitting: Internal Medicine

## 2013-11-23 LAB — FUNGUS CULTURE W SMEAR: Fungal Smear: NONE SEEN

## 2013-11-25 NOTE — Progress Notes (Signed)
I saw and evaluated the patient.  I personally confirmed the key portions of Dr. Rabbani's history and exam and reviewed pertinent patient test results.  The assessment, diagnosis, and plan were formulated together and I agree with the documentation in the resident's note. 

## 2013-11-30 ENCOUNTER — Ambulatory Visit (INDEPENDENT_AMBULATORY_CARE_PROVIDER_SITE_OTHER): Payer: Medicaid Other | Admitting: Infectious Disease

## 2013-11-30 ENCOUNTER — Encounter: Payer: Self-pay | Admitting: Infectious Disease

## 2013-11-30 VITALS — BP 116/77 | HR 77 | Temp 98.0°F | Wt 191.0 lb

## 2013-11-30 DIAGNOSIS — D696 Thrombocytopenia, unspecified: Secondary | ICD-10-CM

## 2013-11-30 DIAGNOSIS — A4902 Methicillin resistant Staphylococcus aureus infection, unspecified site: Secondary | ICD-10-CM

## 2013-11-30 DIAGNOSIS — R7881 Bacteremia: Secondary | ICD-10-CM

## 2013-11-30 DIAGNOSIS — J90 Pleural effusion, not elsewhere classified: Secondary | ICD-10-CM

## 2013-11-30 DIAGNOSIS — J1520 Pneumonia due to staphylococcus, unspecified: Secondary | ICD-10-CM

## 2013-11-30 DIAGNOSIS — B1920 Unspecified viral hepatitis C without hepatic coma: Secondary | ICD-10-CM

## 2013-11-30 NOTE — Progress Notes (Signed)
Subjective:    Patient ID: [name] [name], male    DOB: 1951/08/17, 62 y.o.   MRN: [id number]  HPI  62 y.o. male with Hep C, cirrhosis admitted with Pneumonia, pleural effusion and MRSA BACTEREMIA. HE UNDERWENT TRANSESOPHAGEAL ECHOCARDIOGRAM WHICH FAILED TO SHOW ANY EVIDENCE OF ENDOCARDITIS.   He also underwent thoracocentesis by pulmonary medicine and this fluid was not felt to be consistent with empyema.  Or concerns that he had high-risk for relapse and that he had the onset staph aureus bacteremia we decided to elect for an aggressive approach and treated with 4 weeks of IV vancomycin. He recently finished his IV vancomycin approximately a week ago and this is evident in his vancomycin trough which is now approximately 2. His PICC line is still in place in his needs to come out. He feels much improved his breathing much more easily is without fevers chills nausea or malaise.    Review of Systems  Constitutional: Positive for appetite change. Negative for fever, chills, diaphoresis, activity change, fatigue and unexpected weight change.  HENT: Negative for congestion, rhinorrhea, sinus pressure, sneezing, sore throat and trouble swallowing.   Eyes: Negative for photophobia and visual disturbance.  Respiratory: Negative for cough, chest tightness, shortness of breath, wheezing and stridor.   Cardiovascular: Negative for chest pain, palpitations and leg swelling.  Gastrointestinal: Negative for nausea, vomiting, abdominal pain, diarrhea, constipation, blood in stool, abdominal distention and anal bleeding.  Genitourinary: Negative for dysuria, hematuria, flank pain and difficulty urinating.  Musculoskeletal: Negative for arthralgias, back pain, gait problem, joint swelling and myalgias.  Skin: Negative for color change, pallor, rash and wound.  Neurological: Negative for dizziness, tremors, weakness and light-headedness.  Hematological: Negative for adenopathy. Does not bruise/bleed easily.   Psychiatric/Behavioral: Negative for behavioral problems, confusion, sleep disturbance, dysphoric mood, decreased concentration and agitation.       Objective:   Physical Exam  Constitutional: He is oriented to person, place, and time. No distress.  HENT:  Head: Normocephalic and atraumatic.  Mouth/Throat: Oropharynx is clear and moist. No oropharyngeal exudate.  Eyes: Conjunctivae and EOM are normal.  Neck: Normal range of motion. Neck supple.  Cardiovascular: Normal rate, regular rhythm and normal heart sounds.  Exam reveals no gallop and no friction rub.   No murmur heard. Pulmonary/Chest: Effort normal and breath sounds normal. No respiratory distress. He has no wheezes. He has no rales.  Abdominal: He exhibits distension. There is no tenderness.  Musculoskeletal: He exhibits edema. He exhibits no tenderness.  Neurological: He is alert and oriented to person, place, and time. He exhibits normal muscle tone. Coordination normal.  Skin: Skin is warm and dry. He is not diaphoretic. No erythema. No pallor.  Psychiatric: He has a normal mood and affect. His behavior is normal. Judgment and thought content normal.   picc WAS CDI         Assessment & Plan:   MRSA staphylococcus aureus bacteremia:   We have given him a four-week course of therapy for complicated staph aureus bacteremia.  We'll remove the PICC line today will have him come back in a few weeks and check surveillance cultures.  I spent greater than 25 minutes with the patient including greater than 50% of time in face to face counsel of the patient and in coordination of their care.   Pneumonia with pleural effusion: Pneumonia presumed due to methicillin resistant staph aureus and presumed source of his bacteremia will recheck a chest x-ray in the next few days try  to do today but we could not get it done today.  Hepatitis C is in need of treatment and should be treated with all oral drugs. We could potentially  offer him that through this clinic provided his Medicaid and/or [employer] coverage would cover the medicine cost.  Cirrhosis: will need a hepatologist as well  TTPenia: stable

## 2013-12-06 ENCOUNTER — Encounter: Payer: Self-pay | Admitting: Infectious Disease

## 2013-12-08 LAB — AFB CULTURE WITH SMEAR (NOT AT ARMC): Acid Fast Smear: NONE SEEN

## 2013-12-13 ENCOUNTER — Encounter: Payer: Self-pay | Admitting: Infectious Disease

## 2013-12-15 ENCOUNTER — Ambulatory Visit (INDEPENDENT_AMBULATORY_CARE_PROVIDER_SITE_OTHER): Payer: Medicaid Other | Admitting: Internal Medicine

## 2013-12-15 VITALS — BP 90/60 | HR 70 | Temp 97.0°F

## 2013-12-15 DIAGNOSIS — B1920 Unspecified viral hepatitis C without hepatic coma: Secondary | ICD-10-CM

## 2013-12-15 DIAGNOSIS — H6122 Impacted cerumen, left ear: Secondary | ICD-10-CM

## 2013-12-15 DIAGNOSIS — J069 Acute upper respiratory infection, unspecified: Secondary | ICD-10-CM

## 2013-12-15 DIAGNOSIS — R197 Diarrhea, unspecified: Secondary | ICD-10-CM

## 2013-12-15 DIAGNOSIS — J1520 Pneumonia due to staphylococcus, unspecified: Secondary | ICD-10-CM

## 2013-12-15 DIAGNOSIS — H6120 Impacted cerumen, unspecified ear: Secondary | ICD-10-CM

## 2013-12-15 LAB — CBC WITH DIFFERENTIAL/PLATELET
Basophils Absolute: 0 10*3/uL (ref 0.0–0.1)
Basophils Relative: 0 % (ref 0–1)
Eosinophils Absolute: 0 10*3/uL (ref 0.0–0.7)
Eosinophils Relative: 0 % (ref 0–5)
HCT: 38.9 % — ABNORMAL LOW (ref 39.0–52.0)
HEMOGLOBIN: 14 g/dL (ref 13.0–17.0)
LYMPHS ABS: 1.5 10*3/uL (ref 0.7–4.0)
Lymphocytes Relative: 15 % (ref 12–46)
MCH: 35.4 pg — AB (ref 26.0–34.0)
MCHC: 36 g/dL (ref 30.0–36.0)
MCV: 98.2 fL (ref 78.0–100.0)
MONOS PCT: 10 % (ref 3–12)
Monocytes Absolute: 1 10*3/uL (ref 0.1–1.0)
NEUTROS PCT: 75 % (ref 43–77)
Neutro Abs: 7 10*3/uL (ref 1.7–7.7)
Platelets: 165 10*3/uL (ref 150–400)
RBC: 3.96 MIL/uL — AB (ref 4.22–5.81)
RDW: 14.4 % (ref 11.5–15.5)
WBC: 9.5 10*3/uL (ref 4.0–10.5)

## 2013-12-15 LAB — COMPLETE METABOLIC PANEL WITH GFR
ALBUMIN: 2.5 g/dL — AB (ref 3.5–5.2)
ALT: 39 U/L (ref 0–53)
AST: 74 U/L — AB (ref 0–37)
Alkaline Phosphatase: 114 U/L (ref 39–117)
BUN: 15 mg/dL (ref 6–23)
CO2: 29 mEq/L (ref 19–32)
Calcium: 8.7 mg/dL (ref 8.4–10.5)
Chloride: 96 mEq/L (ref 96–112)
Creat: 1.38 mg/dL — ABNORMAL HIGH (ref 0.50–1.35)
GFR, Est African American: 62 mL/min
GFR, Est Non African American: 54 mL/min — ABNORMAL LOW
Glucose, Bld: 115 mg/dL — ABNORMAL HIGH (ref 70–99)
POTASSIUM: 4.6 meq/L (ref 3.5–5.3)
SODIUM: 127 meq/L — AB (ref 135–145)
Total Bilirubin: 2 mg/dL — ABNORMAL HIGH (ref 0.3–1.2)
Total Protein: 7.3 g/dL (ref 6.0–8.3)

## 2013-12-15 MED ORDER — LACTULOSE 10 GM/15ML PO SOLN: 20.0000 g | Freq: Three times a day (TID) | ORAL | Status: AC

## 2013-12-15 MED ORDER — CARBAMIDE PEROXIDE 6.5 % OT SOLN: 5.0000 [drp] | Freq: Two times a day (BID) | OTIC | Status: AC

## 2013-12-15 NOTE — Progress Notes (Signed)
   Subjective:    Patient ID: [name] [name], male    DOB: 1950/12/20, 63 y.o.   MRN: [id number]  HPI Comments: Mr. Sherald Hessdds is a 63 year old male with a PMH of HTN, HCV, CKD3 and chronic thrombocytopenia who was recently hospitalized for MRSA PNA (six weeks ago) which was treated with four weeks of IV vancomycin.  He presents with a four day history of productive cough, rhinorrhea, myalgias, N/V and diarrhea.  He denies fevers/chills, headache, decreased appetite, hemoptysis or SOB.  He did not get a flu shot this year.  His grandson, whom he says had the flu at the time, stayed with him last weekend.     Review of Systems  Constitutional: Negative for fever, chills and appetite change.  HENT: Positive for rhinorrhea.   Eyes: Negative for photophobia.  Respiratory: Positive for cough. Negative for shortness of breath.   Gastrointestinal: Positive for nausea, vomiting and diarrhea.  Musculoskeletal: Positive for myalgias. Negative for neck pain and neck stiffness.  Neurological: Negative for headaches.       Objective:   Physical Exam  Constitutional: He is oriented to person, place, and time. No distress.  HENT:  Right Ear: External ear normal.  Left Ear: External ear normal.  Mouth/Throat: Oropharynx is clear and moist. No oropharyngeal exudate.  Right TM normal.  Left TM unable to be visualized due to excess cerumen.  Eyes: EOM are normal. Pupils are equal, round, and reactive to light. No scleral icterus.  Neck: Neck supple.  Cardiovascular: Normal rate, regular rhythm and normal heart sounds.   Pulmonary/Chest: Effort normal and breath sounds normal. No respiratory distress. He has no wheezes. He has no rales.  Abdominal: Soft. Bowel sounds are normal. He exhibits no distension. There is no tenderness. There is no rebound.  Musculoskeletal: Normal range of motion.  Lymphadenopathy:    He has no cervical adenopathy.  Neurological: He is alert and oriented to person, place, and time.  No cranial nerve deficit.  Skin: Skin is warm. He is not diaphoretic.  Psychiatric: He has a normal mood and affect. His behavior is normal.          Assessment & Plan:  Please see problem based assessment and plan.

## 2013-12-15 NOTE — Patient Instructions (Signed)
1. You can use 5 drops of Debrox ear drops in your left ear twice daily for the next four days.  Do not use if your ear becomes painful, red or irritated.  Do not use for more than four days.  Return to clinic for ear wax removal next week.  2. Your cough, runny nose and watery eyes for the past four days are likely due to a viral infection.  Continue to drink plenty of fluids. If your symptoms worsen or if you have problems breathing call the clinic.  Go to the emergency room if symptoms are severe.  We will contact you if you chest xray or labs are abnormal or if you need further treatment.  Please keep your appointment with infectious disease doctor next week.   3. Please return the stool kit to clinic as soon as possible so we can test it for C difficile infection.   4. Please take all medications as prescribed.    5. If you have worsening of your symptoms or new symptoms arise, please call the clinic ([phone]), or go to the ER immediately if symptoms are severe.

## 2013-12-16 LAB — HEPATITIS C RNA QUANTITATIVE
HCV Quantitative Log: 6.5 {Log} — ABNORMAL HIGH (ref <1.18)
HCV Quantitative: 3127455 IU/mL — ABNORMAL HIGH (ref <15)

## 2013-12-17 DIAGNOSIS — H6122 Impacted cerumen, left ear: Secondary | ICD-10-CM | POA: Insufficient documentation

## 2013-12-17 DIAGNOSIS — J069 Acute upper respiratory infection, unspecified: Secondary | ICD-10-CM | POA: Insufficient documentation

## 2013-12-17 DIAGNOSIS — R197 Diarrhea, unspecified: Secondary | ICD-10-CM | POA: Insufficient documentation

## 2013-12-17 NOTE — Assessment & Plan Note (Addendum)
Assessment:  Four days of cough, N/V, rhinorrhea, myalgias and watery eyes after contact with daughter and grandson who are sick.  Given the short duration, lack of fever, lack of dyspnea and unremarkable exam this is likely an acute viral illness.  Doubt flu, although he said he was exposed to grandson with flu, given he has no fever or headache and does not appear terribly ill.   Plan:   1) Symptomatic treatment            2) Advised to stay hydrated            3) CBC            4) CXR - it has been > 6 weeks since he began treatment for pneumonia            5) Has ID appointment next week for follow-up and to check surveillance cultures            6) He has been advised to call the clinic if worsening symptoms.  He has been advised to go to the ED with severe symptoms.

## 2013-12-17 NOTE — Assessment & Plan Note (Addendum)
Debrox drops twice daily to left ear for four days.  Return to clinic next week for cleaning.

## 2013-12-17 NOTE — Assessment & Plan Note (Addendum)
Assessment:  Given his N/V and diarrhea of four days duration he may have an acute, self-limited gastroenteritis, however he was just treated with four weeks of antibiotics so will need to rule out c difficile infection.  His SBP is low today but from chart review he tends to run a little low.  He is not tachycardic, dizzy or lightheaded.  He has no leukocytosis or fever.  He is able to eat and drink to stay hydrated.  Plan:  1) He agrees to return the stool sample kit to the clinic as soon as possible for c diff testing.            2) Advised to stay hydrated.            3) Advised to go to the ED with worsening or severe symptoms.

## 2013-12-19 NOTE — Progress Notes (Signed)
Case discussed with Dr. Wilson at the time of the visit.  We reviewed the resident's history and exam and pertinent patient test results.  I agree with the assessment, diagnosis, and plan of care documented in the resident's note. 

## 2013-12-21 ENCOUNTER — Ambulatory Visit: Payer: Medicaid Other | Admitting: Infectious Disease

## 2013-12-21 ENCOUNTER — Ambulatory Visit: Payer: Medicaid Other | Admitting: Internal Medicine

## 2013-12-26 ENCOUNTER — Encounter: Payer: Self-pay | Admitting: Infectious Disease

## 2014-03-28 ENCOUNTER — Encounter: Payer: Self-pay | Admitting: Infectious Disease

## 2014-06-03 IMAGING — CT CT ABD-PELV W/O CM
2 of 4 series · 15 of 46 positions shown, 17 images · non-contrast
Comparison: Abdominal radiographs from 4744 hr the same day. CT
Abdomen and Pelvis 10/24/2013.

CLINICAL DATA: 62-year-old male with worsening abdominal pain and
distention. Earlier abdominal radiographs for which pneumoperitoneum
was difficult to exclude. Recently diagnosed cirrhosis and possible
mild pancreatitis. Initial encounter.

EXAM:
CT ABDOMEN AND PELVIS WITHOUT CONTRAST
TECHNIQUE: Multidetector CT imaging of the abdomen and pelvis was performed
following the standard protocol without intravenous contrast.

[Series 2: abd/ pelvis 5.0 i30f 1 · axial · 0.81mm/px · z∈[-918,-418]mm · 12 of 110 slices shown, 14 images]
[im 5/110  soft-tissue]
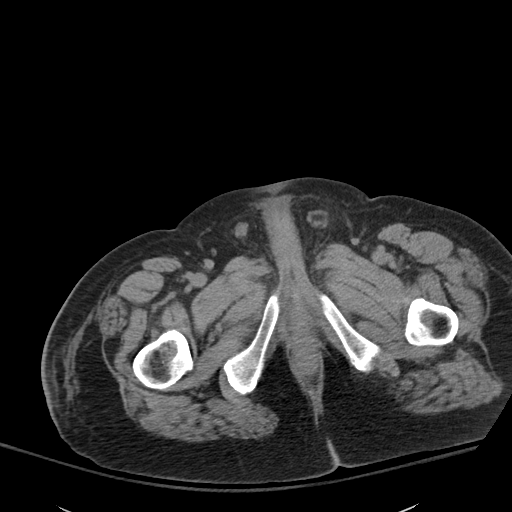
[im 5/110  bone]
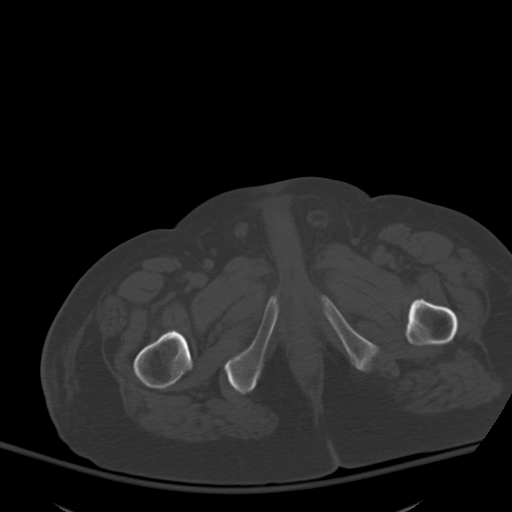
[im 14/110  soft-tissue]
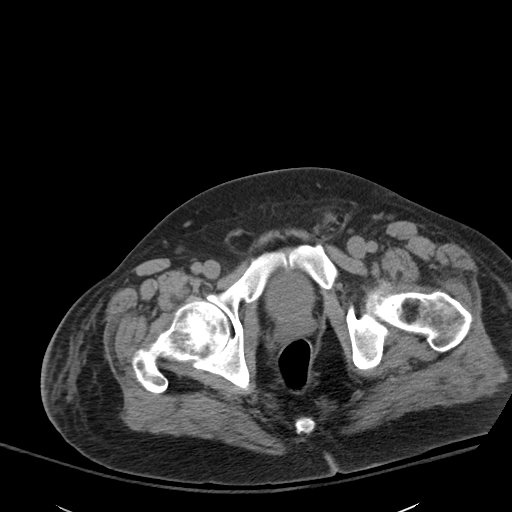
[im 23/110  soft-tissue]
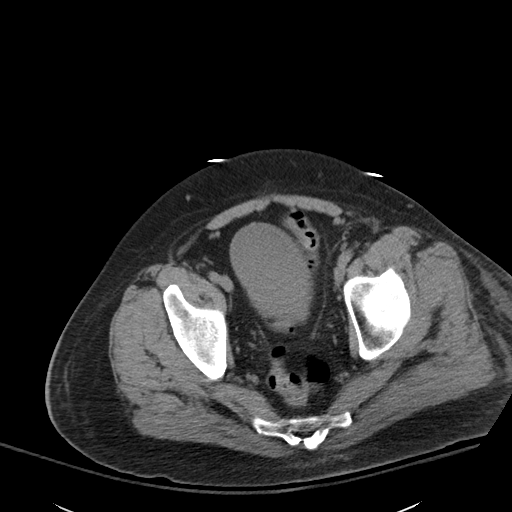
[im 32/110  soft-tissue]
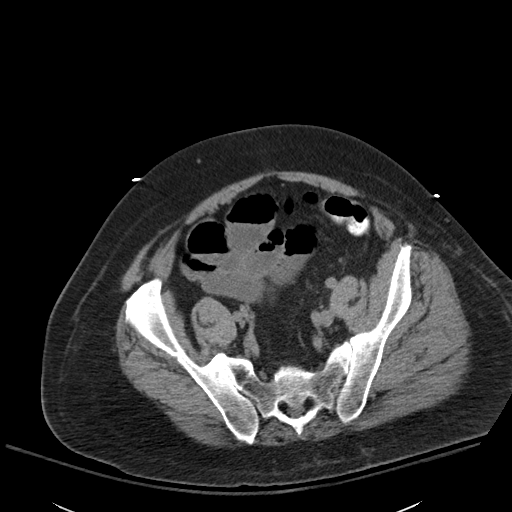
[im 41/110  soft-tissue]
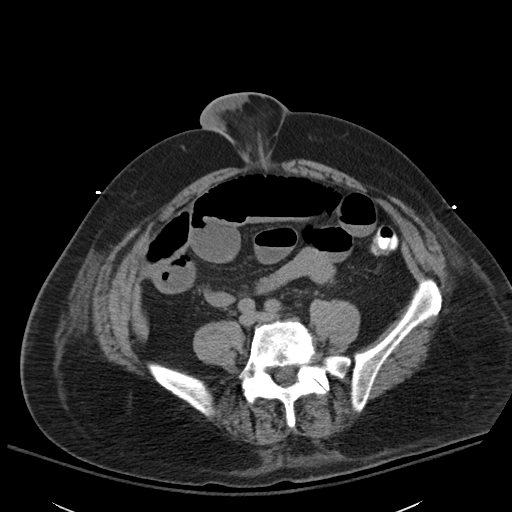
[im 50/110  soft-tissue]
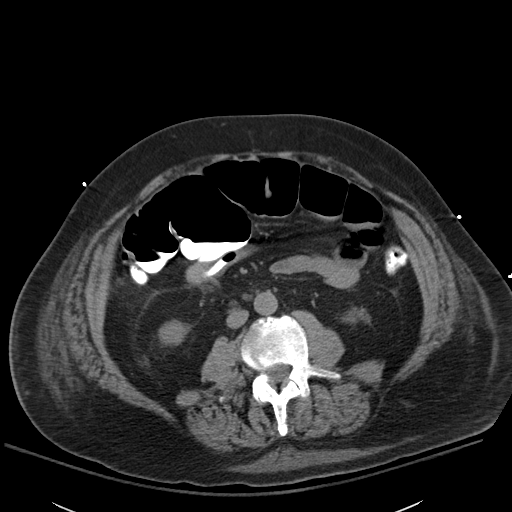
[im 60/110  soft-tissue]
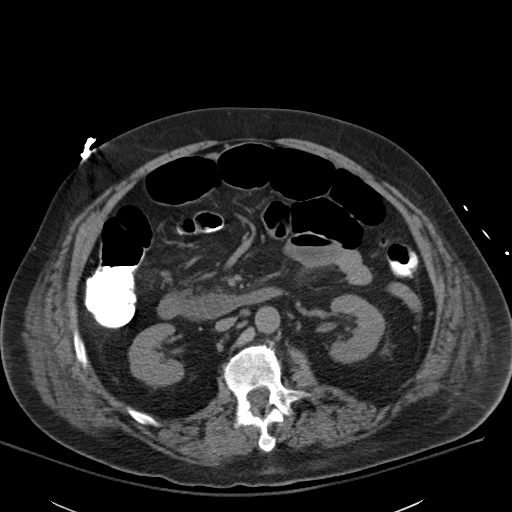
[im 69/110  soft-tissue]
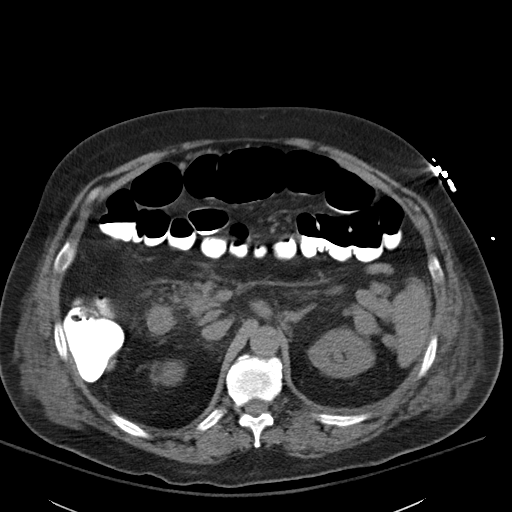
[im 78/110  soft-tissue]
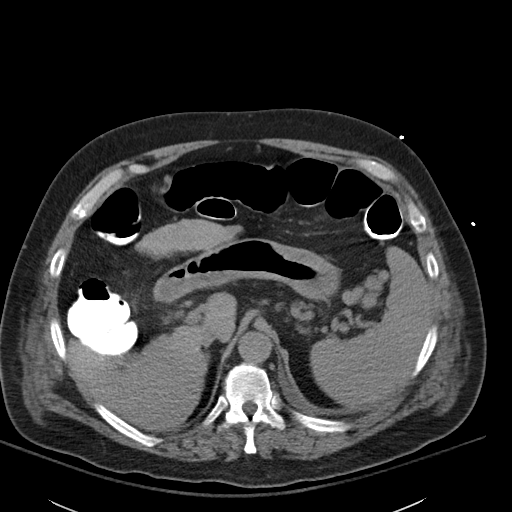
[im 78/110  bone]
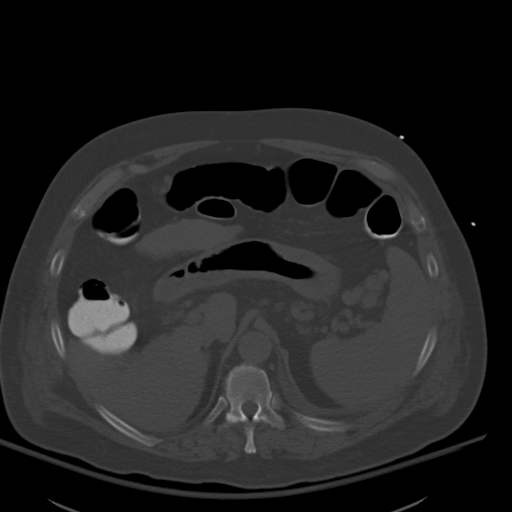
[im 87/110  soft-tissue]
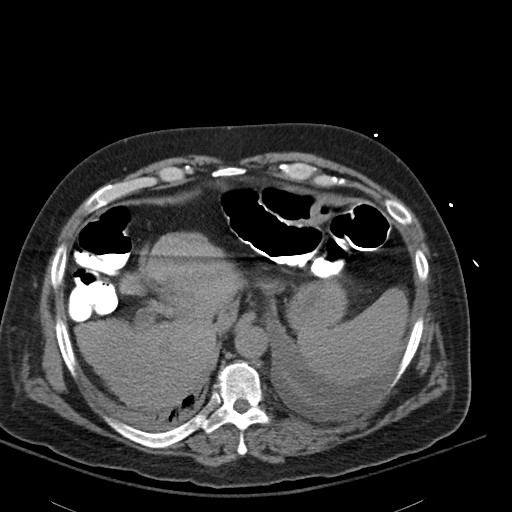
[im 96/110  soft-tissue]
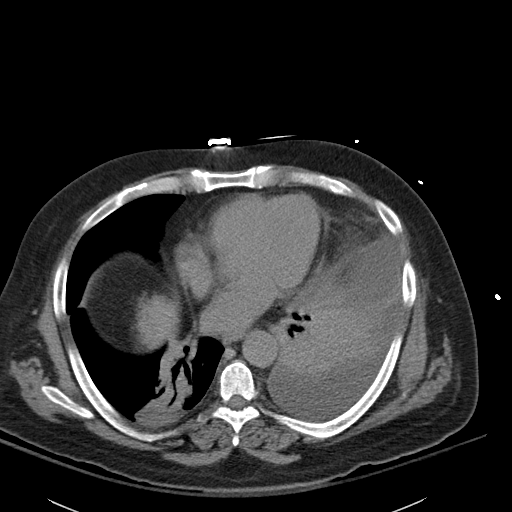
[im 105/110  soft-tissue]
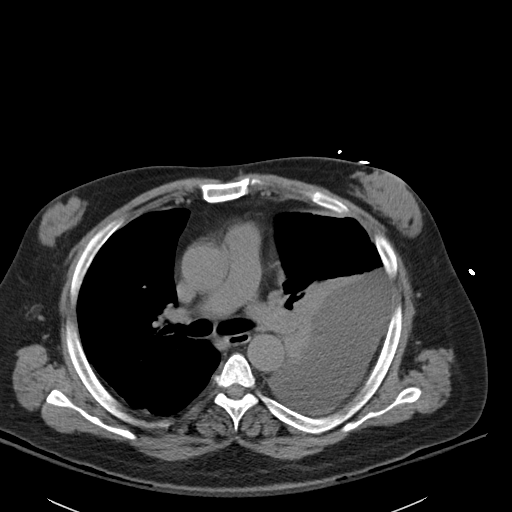

[Series 5: cor st · coronal · 0.86mm/px · 3 of 101 slices shown]
[im 34/101  soft-tissue]
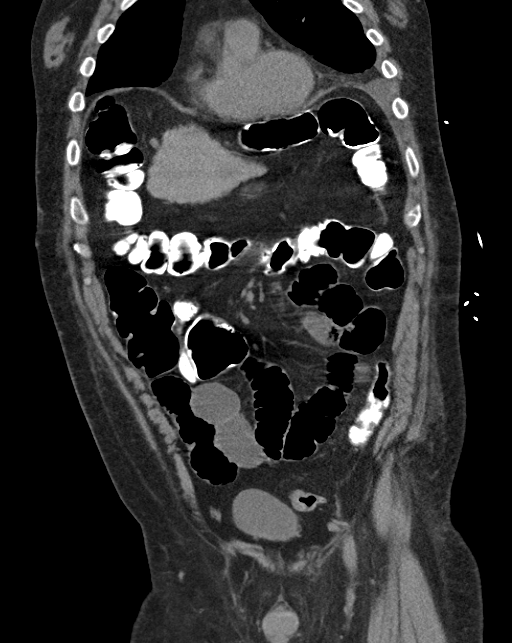
[im 45/101  soft-tissue]
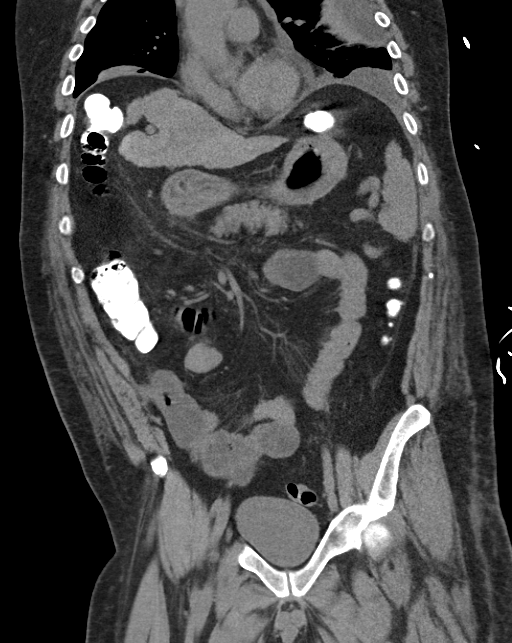
[im 56/101  soft-tissue]
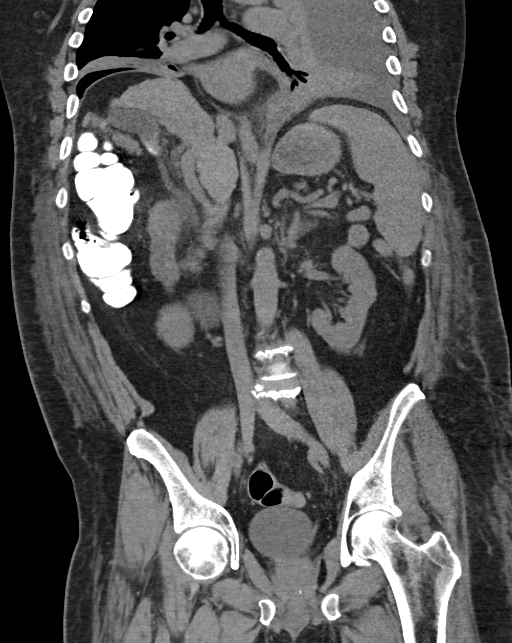

[15 of 46 positions shown; findings below may reference images not displayed]

FINDINGS: No additional contrast administered for this scan.

Interval increased left pleural effusion, moderate to large and with
complete left lower lobe atelectasis. No pericardial effusion. No
right pleural effusion. Continued confluent right lower lobe basal
segment atelectasis versus consolidation with air bronchograms. This
finding might have stimulated pneumoperitoneum on the earlier
radiographs.

Gynecomastia.

Stable visualized osseous structures. Disc and endplate degeneration
in the lumbar spine.

No pneumoperitoneum.

Trace fluid in an otherwise fact containing left inguinal hernia is
stable. No free fluid in the pelvis. Unremarkable bladder. Negative
distal colon. Sigmoid diverticulosis but no active inflammation.
Oral contrast has reached the proximal sigmoid colon. Negative left
colon. Negative transverse colon. Negative right colon, cecum and
appendix.

Featureless air and fluid filled small bowel loops with increased
small bowel caliber since 10/24/2013, now up to 41 mm diameter.
Still, no small bowel wall thickening. No abrupt small bowel
transition point.

Nodular cirrhotic liver appears stable. Cholelithiasis. No
pericholecystic inflammation. Stable spleen with suggestion of
splint around all varices. Stable non contrast kidneys. No
hydronephrosis or hydroureter.

The stomach now is decompressed. There is persistent inflammation in
the lesser sac primarily about the head of the pancreas and duodenum
(series 2, image 41). No associated fluid collection. No abdominal
free fluid.

Umbilical hernia containing fat an a small volume of fluid is
stable.
IMPRESSION: 1. Negative for pneumoperitoneum.

2. Appearance of bowel most compatible with ileus. Interval dilation
of small bowel up to 41 mm diameter, but contrast and gas throughout
the colon and no small bowel transition point.

3. Continued mild to moderate inflammation about the head of the
pancreas with secondary involvement of the duodenum. No fluid
collection or free fluid.

4. Large layering left pleural effusion has increased. Complete left
lower lobe compressive atelectasis. Continued confluent right lung
base atelectasis versus consolidation.

5. Cirrhosis. Cholelithiasis. Sigmoid diverticulosis. Small
umbilical and left inguinal hernias containing fat and trace fluid.

## 2014-06-04 IMAGING — CR DG CHEST 1V PORT
1 series · 1 of 1 positions shown · non-contrast
Comparison: [DATE] chest radiograph CT 1563 hr

CLINICAL DATA: Status post left thoracentesis.

EXAM:
PORTABLE CHEST - 1 VIEW

[AP]
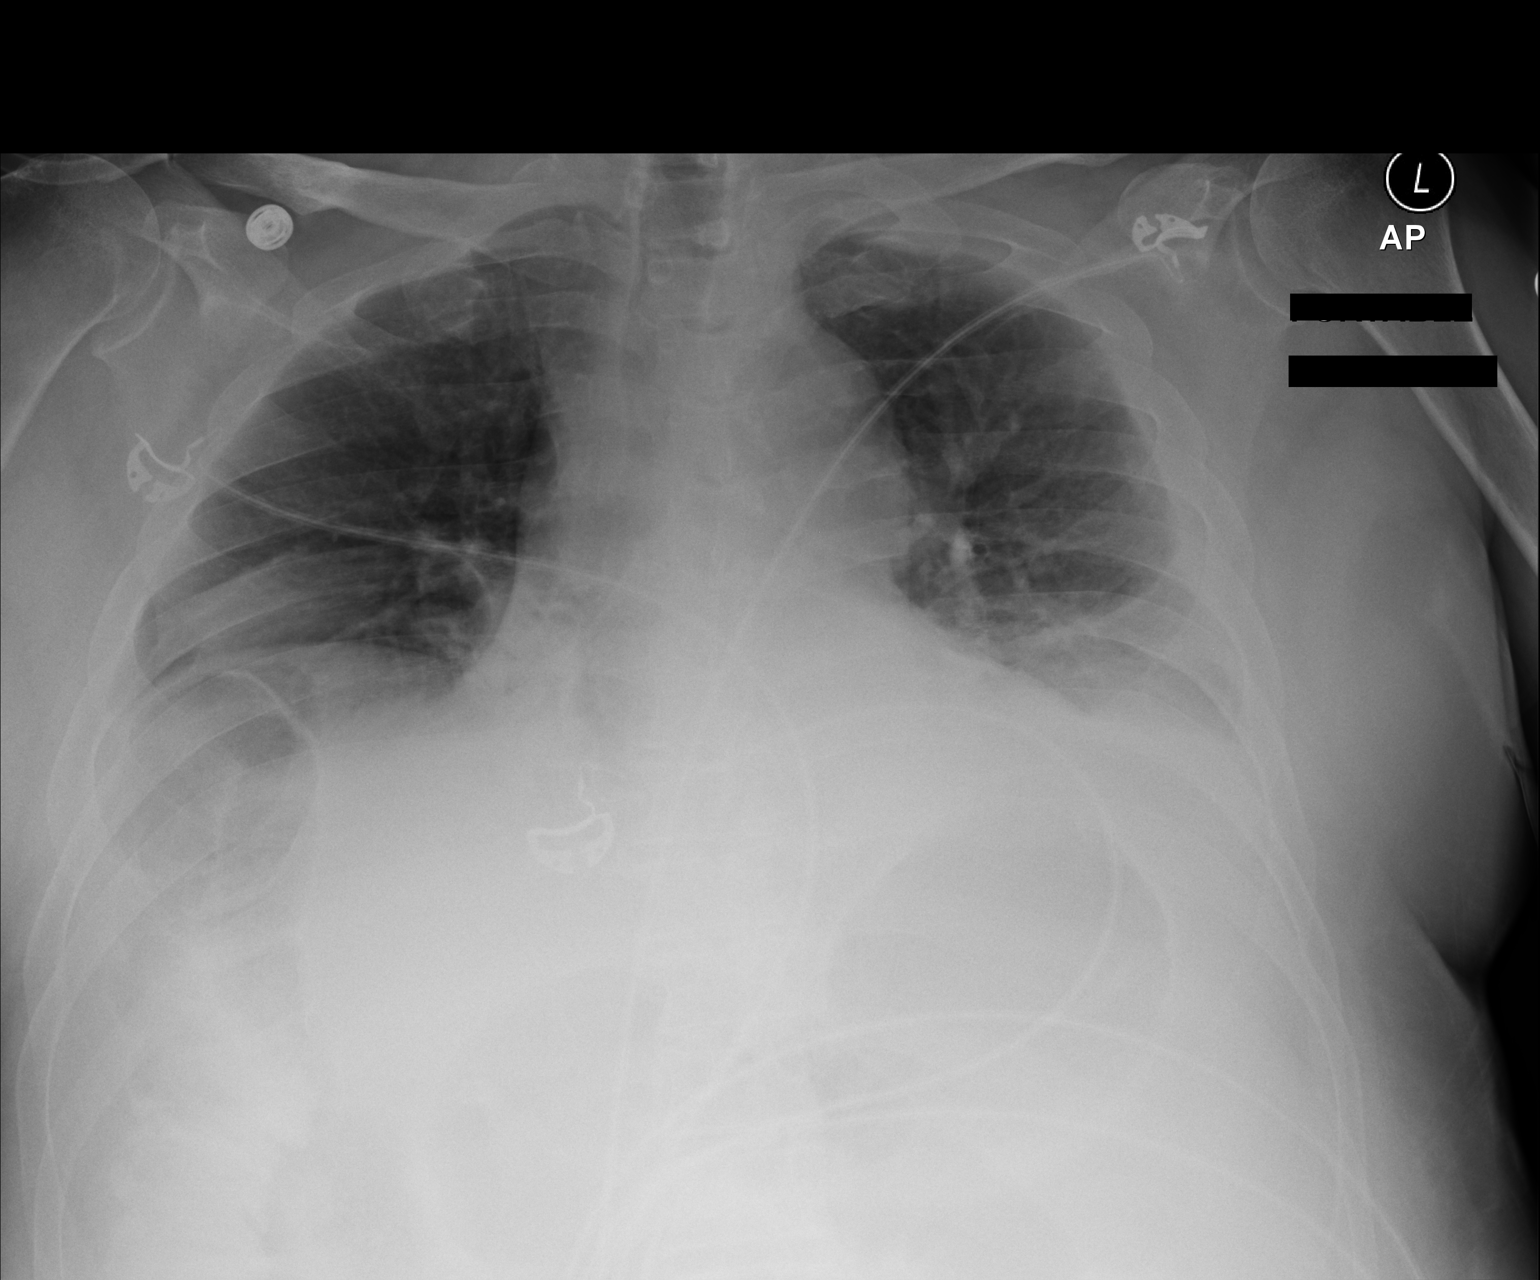

[1 of 1 positions shown; findings below may reference images not displayed]

FINDINGS: There is a small left pleural effusion, significantly decreased in
size compared to the prior study. No pneumothorax is identified. The
lungs are hypoinflated, more so than on the prior study. There is
slightly improved aeration at the left lung base. Persistent opacity
in the left lung base and streaky opacity in the right lung base may
represent atelectasis. There is persistent more confluent opacity in
the medial right lung base.
IMPRESSION: 1. Greatly decreased size of left pleural effusion following
thoracentesis. No pneumothorax identified.
2. Hypoinflated lungs with bibasilar opacities, suggestive of
atelectasis although infection is not excluded, particularly in the
medial right lung base.
